# Patient Record
Sex: Female | Born: 1940 | Race: White | Hispanic: No | Smoking: Former smoker
Health system: Southern US, Community
[De-identification: ages and names within clinical notes are randomized; demographics above are authoritative.]

## PROBLEM LIST (undated history)

## (undated) DIAGNOSIS — E785 Hyperlipidemia, unspecified: Secondary | ICD-10-CM

## (undated) DIAGNOSIS — R5382 Chronic fatigue, unspecified: Secondary | ICD-10-CM

## (undated) DIAGNOSIS — F411 Generalized anxiety disorder: Secondary | ICD-10-CM

## (undated) DIAGNOSIS — H269 Unspecified cataract: Secondary | ICD-10-CM

## (undated) DIAGNOSIS — M810 Age-related osteoporosis without current pathological fracture: Secondary | ICD-10-CM

## (undated) DIAGNOSIS — H33012 Retinal detachment with single break, left eye: Secondary | ICD-10-CM

## (undated) DIAGNOSIS — Z9849 Cataract extraction status, unspecified eye: Secondary | ICD-10-CM

## (undated) DIAGNOSIS — J309 Allergic rhinitis, unspecified: Secondary | ICD-10-CM

## (undated) DIAGNOSIS — J4530 Mild persistent asthma, uncomplicated: Secondary | ICD-10-CM

## (undated) DIAGNOSIS — R7303 Prediabetes: Secondary | ICD-10-CM

## (undated) DIAGNOSIS — N814 Uterovaginal prolapse, unspecified: Secondary | ICD-10-CM

## (undated) DIAGNOSIS — R7301 Impaired fasting glucose: Secondary | ICD-10-CM

## (undated) DIAGNOSIS — H409 Unspecified glaucoma: Secondary | ICD-10-CM

## (undated) HISTORY — DX: Mild persistent asthma, uncomplicated: J45.30

## (undated) HISTORY — DX: Allergic rhinitis, unspecified: J30.9

## (undated) HISTORY — DX: Impaired fasting glucose: R73.01

## (undated) HISTORY — DX: Generalized anxiety disorder: F41.1

## (undated) HISTORY — DX: Prediabetes: R73.03

## (undated) HISTORY — DX: Hyperlipidemia, unspecified: E78.5

## (undated) HISTORY — PX: APPENDECTOMY: SHX54

## (undated) HISTORY — PX: TONSILLECTOMY: SUR1361

## (undated) HISTORY — DX: Uterovaginal prolapse, unspecified: N81.4

## (undated) HISTORY — DX: Cataract extraction status, unspecified eye: Z98.49

## (undated) HISTORY — PX: HAND SURGERY: SHX662

## (undated) HISTORY — DX: Retinal detachment with single break, left eye: H33.012

## (undated) HISTORY — DX: Chronic fatigue, unspecified: R53.82

## (undated) HISTORY — DX: Unspecified glaucoma: H40.9

## (undated) HISTORY — DX: Age-related osteoporosis without current pathological fracture: M81.0

## (undated) HISTORY — DX: Unspecified cataract: H26.9

---

## 2001-01-31 ENCOUNTER — Encounter: Payer: Self-pay | Admitting: Internal Medicine

## 2001-01-31 ENCOUNTER — Ambulatory Visit (HOSPITAL_COMMUNITY): Admission: RE | Admit: 2001-01-31 | Discharge: 2001-01-31 | Payer: Self-pay | Admitting: Internal Medicine

## 2007-04-17 ENCOUNTER — Observation Stay (HOSPITAL_COMMUNITY): Admission: EM | Admit: 2007-04-17 | Discharge: 2007-04-18 | Payer: Self-pay | Admitting: Emergency Medicine

## 2008-02-13 ENCOUNTER — Ambulatory Visit (HOSPITAL_COMMUNITY): Admission: RE | Admit: 2008-02-13 | Discharge: 2008-02-13 | Payer: Self-pay | Admitting: Family Medicine

## 2008-03-01 ENCOUNTER — Other Ambulatory Visit: Admission: RE | Admit: 2008-03-01 | Discharge: 2008-03-01 | Payer: Self-pay | Admitting: Family Medicine

## 2010-06-27 ENCOUNTER — Ambulatory Visit (HOSPITAL_COMMUNITY): Admission: RE | Admit: 2010-06-27 | Discharge: 2010-06-27 | Payer: Self-pay | Admitting: Family Medicine

## 2010-07-04 ENCOUNTER — Other Ambulatory Visit: Admission: RE | Admit: 2010-07-04 | Discharge: 2010-07-04 | Payer: Self-pay | Admitting: *Deleted

## 2011-02-06 NOTE — Consult Note (Signed)
NAME:  Angelica Serrano, Angelica Serrano                   ACCOUNT NO.:  [id number]   MEDICAL RECORD NO.:  [id number]          PATIENT TYPE:  OBV   LOCATION:  2025                         FACILITY:  [facility]   PHYSICIAN:  Corinna L. Lendell Caprice, MDDATE OF BIRTH:  1941/04/03   DATE OF CONSULTATION:  04/17/2007  DATE OF DISCHARGE:                                 CONSULTATION   REASON FOR CONSULTATION:  Abnormal chest x-ray, rule out pneumonia, rule  out otitis media.   IMPRESSION/RECOMMENDATIONS:  1. Despite portable chest x-ray findings, there is no infiltrate on PA      and lateral chest x-ray.  Also there is no clinical evidence for      pneumonia.  No antibiotics are indicated at this time.  2. No clinical signs of otitis media.  3. Resolved vertigo.  Recommend Meclizine as needed if this returns.  4. Vomiting most likely secondary to #3.   HISTORY OF PRESENT ILLNESS:  Angelica Serrano is a 70 year old white female who  has seen Dr. Delrae Alfred in the past but has not found a new primary care  physician since Dr. Delrae Alfred has left [place] at [facility].  She had  some dizziness when she turned her head to the right earlier today.  She  also had some nausea and vomiting and possibly a loose stool, but it  sounds as if it was not truly diarrhea.  She has had no fevers, chills.  She has had no ear pain.  She has had no cough.  She has had no  shortness of breath.  She did have some diaphoresis, and apparently her  blood pressure was elevated at home when she took it with a cuff.  [facility], and there were  concerns about abnormalities on EKG, so she was sent to the emergency  room.  She is being admitted to the cardiology service and apparently  will get a stress test in the morning.  She has no history of coronary  artery disease.  She has never had an episode like this in the past.  She has no tinnitus, no headache, no difficulty walking or focal  weakness.   PAST  MEDICAL HISTORY:  Seasonal allergic rhinitis.   MEDICATIONS:  Zyrtec as needed.   SOCIAL HISTORY:  The patient quit smoking years ago.  She drinks rarely.   FAMILY HISTORY:  Her mother died of pneumonia.  Dad died of prostate  cancer.   ALLERGIES:  NO KNOWN DRUG ALLERGIES.   REVIEW OF SYSTEMS:  As above, otherwise negative.   PHYSICAL EXAMINATION:  VITAL SIGNS:  Blood pressure 166/82, pulse 59,  respiratory rate 18, temperature 97.7, oxygen saturation 99% on room  air.  GENERAL:  The patient is well-nourished, well-developed, in no acute  distress.  HEENT:  Normocephalic, atraumatic.  Pupils equal, round, and reactive to  light.  No nystagmus.  Left ear canal is occluded with cerumen.  Right  tympanic membrane is without any bulging or purulence.  No erythema.  She has no tenderness of  the pinna or canal.  Nares are patent.  Oropharynx without erythema or exudate.  NECK:  Supple.  No lymphadenopathy.  LUNGS:  Clear to auscultation bilaterally without wheezes, rhonchi, or  rales.  CARDIOVASCULAR:  Regular rate and rhythm without murmurs, gallops, or  rubs.  ABDOMEN:  Normal bowel sounds, soft, nontender, nondistended.  GU AND RECTAL:  Deferred.  EXTREMITIES:  No clubbing, cyanosis, or edema.  NEUROLOGIC:  Cranial nerves are intact.  Alert and oriented.  Motor  strength 5/5 throughout.  Light touch sensation intact.  SKIN:  No rash.   LABORATORY DATA:  CBC is unremarkable.  Basic metabolic panel  unremarkable.  Liver function tests unremarkable.  Point-of-care enzymes  negative.  EKG shows normal sinus rhythm with nonspecific changes.  Portable chest x-ray shows possible right lower lobe infiltrate.  PA and  lateral chest x-ray shows no infiltrate.  Abdominal x-ray is  unremarkable.   I would like to thank Dr. Mayford Knife for this consultation.      Corinna L. Lendell Caprice, MD  Electronically Signed     CLS/MEDQ  D:  04/17/2007  T:  04/18/2007  Job:  [id number]   cc:    Particia Jasper, M.D.  Armanda Magic, M.D.

## 2011-06-15 ENCOUNTER — Other Ambulatory Visit: Payer: Self-pay | Admitting: Family Medicine

## 2011-06-15 DIAGNOSIS — Z1231 Encounter for screening mammogram for malignant neoplasm of breast: Secondary | ICD-10-CM

## 2011-07-03 ENCOUNTER — Ambulatory Visit (HOSPITAL_COMMUNITY): Payer: PRIVATE HEALTH INSURANCE

## 2011-07-03 ENCOUNTER — Other Ambulatory Visit: Payer: Self-pay | Admitting: Dermatology

## 2011-07-09 LAB — DIFFERENTIAL
Basophils Relative: 1
Eosinophils Absolute: 0.1
Eosinophils Relative: 1
Lymphs Abs: 1.4
Monocytes Relative: 5

## 2011-07-09 LAB — CARDIAC PANEL(CRET KIN+CKTOT+MB+TROPI)
CK, MB: 1.8
Total CK: 110
Troponin I: 0.02

## 2011-07-09 LAB — COMPREHENSIVE METABOLIC PANEL
ALT: 16
AST: 23
Alkaline Phosphatase: 79
CO2: 23
Calcium: 9.4
GFR calc Af Amer: >60
GFR calc non Af Amer: >60
Potassium: 3.7
Sodium: 139
Total Protein: 6.9

## 2011-07-09 LAB — CBC
HCT: 40.2
Hemoglobin: 13.9
Hemoglobin: 14
MCHC: 33.8
MCHC: 34.6
MCV: 91
RBC: 4.42
RBC: 4.59

## 2011-07-09 LAB — URINALYSIS, ROUTINE W REFLEX MICROSCOPIC
Bilirubin Urine: NEGATIVE
Glucose, UA: NEGATIVE
Specific Gravity, Urine: 1.016
Urobilinogen, UA: 0.2
pH: 5.5

## 2011-07-09 LAB — CK TOTAL AND CKMB (NOT AT ARMC)
Relative Index: 1.8
Total CK: 133

## 2011-07-09 LAB — URINE MICROSCOPIC-ADD ON

## 2011-07-09 LAB — LIPID PANEL
Total CHOL/HDL Ratio: 5.9
VLDL: 41 — ABNORMAL HIGH

## 2011-07-09 LAB — POCT CARDIAC MARKERS: Troponin i, poc: <0.05

## 2011-07-09 LAB — MAGNESIUM: Magnesium: 2.5

## 2011-07-26 ENCOUNTER — Other Ambulatory Visit: Payer: Self-pay | Admitting: Dermatology

## 2011-07-31 ENCOUNTER — Ambulatory Visit (HOSPITAL_COMMUNITY): Payer: PRIVATE HEALTH INSURANCE

## 2011-08-28 ENCOUNTER — Ambulatory Visit (HOSPITAL_COMMUNITY): Payer: PRIVATE HEALTH INSURANCE

## 2011-09-24 ENCOUNTER — Ambulatory Visit (HOSPITAL_COMMUNITY)
Admission: RE | Admit: 2011-09-24 | Discharge: 2011-09-24 | Disposition: A | Discharge status: Home / Self Care | Payer: PRIVATE HEALTH INSURANCE | Source: Ambulatory Visit | Attending: Family Medicine | Admitting: Family Medicine

## 2011-09-24 DIAGNOSIS — Z1231 Encounter for screening mammogram for malignant neoplasm of breast: Secondary | ICD-10-CM | POA: Insufficient documentation

## 2013-04-29 ENCOUNTER — Other Ambulatory Visit (HOSPITAL_COMMUNITY): Payer: Self-pay | Admitting: Family Medicine

## 2013-04-29 DIAGNOSIS — Z1231 Encounter for screening mammogram for malignant neoplasm of breast: Secondary | ICD-10-CM

## 2013-05-18 ENCOUNTER — Ambulatory Visit (HOSPITAL_COMMUNITY)
Admission: RE | Admit: 2013-05-18 | Discharge: 2013-05-18 | Disposition: A | Discharge status: Home / Self Care | Payer: PRIVATE HEALTH INSURANCE | Source: Ambulatory Visit | Attending: Family Medicine | Admitting: Family Medicine

## 2013-05-18 DIAGNOSIS — Z1231 Encounter for screening mammogram for malignant neoplasm of breast: Secondary | ICD-10-CM | POA: Insufficient documentation

## 2013-10-19 ENCOUNTER — Other Ambulatory Visit: Payer: Self-pay | Admitting: Obstetrics & Gynecology

## 2014-04-19 ENCOUNTER — Other Ambulatory Visit: Payer: Self-pay | Admitting: Obstetrics & Gynecology

## 2014-04-19 ENCOUNTER — Other Ambulatory Visit (HOSPITAL_COMMUNITY)
Admission: RE | Admit: 2014-04-19 | Discharge: 2014-04-19 | Disposition: A | Discharge status: Home / Self Care | Payer: Medicare HMO | Source: Ambulatory Visit | Attending: Obstetrics & Gynecology | Admitting: Obstetrics & Gynecology

## 2014-04-19 DIAGNOSIS — Z1151 Encounter for screening for human papillomavirus (HPV): Secondary | ICD-10-CM | POA: Diagnosis present

## 2014-04-19 DIAGNOSIS — Z124 Encounter for screening for malignant neoplasm of cervix: Secondary | ICD-10-CM | POA: Diagnosis present

## 2014-04-19 DIAGNOSIS — R87810 Cervical high risk human papillomavirus (HPV) DNA test positive: Secondary | ICD-10-CM | POA: Diagnosis present

## 2014-04-20 LAB — CYTOLOGY - PAP

## 2014-07-06 ENCOUNTER — Other Ambulatory Visit (HOSPITAL_COMMUNITY): Payer: Self-pay | Admitting: Family Medicine

## 2014-07-06 DIAGNOSIS — Z1231 Encounter for screening mammogram for malignant neoplasm of breast: Secondary | ICD-10-CM

## 2014-08-02 ENCOUNTER — Ambulatory Visit (HOSPITAL_COMMUNITY)
Admission: RE | Admit: 2014-08-02 | Discharge: 2014-08-02 | Disposition: A | Discharge status: Home / Self Care | Payer: Medicare HMO | Source: Ambulatory Visit | Attending: Family Medicine | Admitting: Family Medicine

## 2014-08-02 DIAGNOSIS — Z1231 Encounter for screening mammogram for malignant neoplasm of breast: Secondary | ICD-10-CM | POA: Insufficient documentation

## 2014-09-21 ENCOUNTER — Other Ambulatory Visit: Payer: Self-pay

## 2014-09-24 DIAGNOSIS — C44310 Basal cell carcinoma of skin of unspecified parts of face: Secondary | ICD-10-CM

## 2014-09-24 DIAGNOSIS — I440 Atrioventricular block, first degree: Secondary | ICD-10-CM

## 2014-09-24 HISTORY — DX: Atrioventricular block, first degree: I44.0

## 2014-09-24 HISTORY — DX: Basal cell carcinoma of skin of unspecified parts of face: C44.310

## 2015-03-21 ENCOUNTER — Ambulatory Visit: Payer: Medicare HMO | Admitting: Physical Therapy

## 2015-03-23 ENCOUNTER — Encounter: Payer: Self-pay | Admitting: Physical Therapy

## 2015-03-23 ENCOUNTER — Ambulatory Visit: Payer: Commercial Managed Care - HMO | Attending: Obstetrics & Gynecology | Admitting: Physical Therapy

## 2015-03-23 DIAGNOSIS — N8189 Other female genital prolapse: Secondary | ICD-10-CM | POA: Diagnosis present

## 2015-03-23 NOTE — Patient Instructions (Signed)
Quick Contraction: Gravity Eliminated (Hook-Lying)   Lie with hips and knees bent. Quickly squeeze then fully relax pelvic floor. Perform _1__ sets of _5__. Rest for _1__ seconds between sets. Do _3__ times a day.   Copyright  VHI. All rights reserved.  Slow Contraction: Gravity Eliminated (Hook-Lying)   Lie with hips and knees bent. Slowly squeeze pelvic floor for _10__ seconds. Rest for _5__ seconds. Repeat __10_ times. Do __3_ times a day.   Copyright  VHI. All rights reserved.  About Pelvic Support Problems Pelvic Support Problems Explained Ligaments, muscles, and connective tissue normally hold your bladder, uterus, and other organs in their proper places in your pelvis. When these tissues become weak, a problem with pelvic support may result. Weak support can cause one or more of the pelvic organs to drop down into the vagina. An organ may even drop so far that is partially exposed outside the body.  Pelvic support problems are named by the change in the organ. The Curran types of pelvic support problems are:  . Cystocele: When the bladder drops down into your vagina.  . Enterocele: When your small intestine drops between your vagina and rectum.  . Rectocele: When your rectum bulges into the vaginal wall.  Marland Kitchen Uterine prolapse: When your uterus drops into your vagina.  . Vaginal prolapse: When the top part of the vagina begins to droop. This sometimes happens after a hysterectomy (removal of the uterus).  Causes Pelvic support problems can be caused by many conditions. They may begin after you give birth, especially if you had a large baby. During childbirth, the muscles and skin of the birth canal (vagina) are stretched and sometimes torn. They heal over time but are not always exactly the same. A long pushing stage of labor may also weaken these tissues as well as very rapid births as the tissues do not have time to stretch so they tear.  Also, after menopause, there are changes in the  vaginal walls resulting from a decrease in estrogen. Estrogen helps to keep the tissues toned. Low levels of estrogen weaken the vaginal walls and may cause the bladder to shift from its normal position. As women get older, the loss of muscle tone and the relaxation of muscles may cause the uterus or other organs to drop.  Over time, conditions like chronic coughing, chronic constipation, doing a lot of heavy lifting, straining to pass stool, and obesity, can also weaken the pelvic support muscles.  Diagnosing Pelvic Support Problems Your health care provider will ask about your symptoms and do a pelvic examination. Your provider may also do a rectal exam during your pelvic exam. Your provider may ask you to: 1. Bear down and push (like you are having a bowel movement) so he or she can see if your bladder or other part of your body protrudes into the vagina. 2. Contract the muscles of your pelvis to check the strength of your pelvic muscles.  3. Do several types of urine, nerve and muscle tests of the pelvis and around the bladder to see what type of treatment is best for you.   Symptoms Symptoms of pelvic support problems depend on the organ involved, but may include:  . urine leakage  . stain or fecal loss after a bowel movement . trouble having bowel movements  . ache in the lower abdomen, groin, or lower back  . bladder infection  . a feeling of heaviness, pulling, or fullness in the pelvis, or a feeling that  something is falling out of the vagina  . an organ protruding from your vaginal opening  . feeling the need to support the organs or perineal area to empty bladder or bowels . painful sexual intercourse.  Many women feel pelvic pressure or trouble holding their urine immediately after childbirth. For some, these symptoms go away permanently, in others they return as they get older.  Treatment Options A prolapsed organ cannot repair itself. Contact your health care provider as soon as  you notice symptoms of a problem. Treatment depends on what the specific problem is and how far advanced it is.  . The symptoms caused by some pelvic support problems may simply be treated with changes in diet, medicine to soften the stool, weight loss, or avoiding strenuous activities. You may also do pelvic floor exercises to help strengthen your pelvic muscles.  . Some cases of prolapse may require a special support device made from plastic or rubber called a pessary that fits into the vagina to support the uterus, vagina, or bladder. A pessary can also help women who leak urine when coughing, straining, or exercising. In mild cases, a tampon or vaginal diaphragm may be used instead of a pessary.  Talk to your doctor or health care provider about these options. . In serious cases, surgery may be needed to put the organs back into their proper place. The uterus may be removed because of the pressure it puts on the bladder.  Your doctor will know what surgery will be best for you. How can I prevent pelvic support problems?  You can help prevent pelvic support problems by:  . maintaining a healthy lifestyle  . continuing to do pelvic floor exercises after you deliver a baby  . maintaining a healthy weight  . avoiding a lot of heavy lifting and lifting with your legs (not from your waist)  . treating constipation and avoid getting constipated by eating high fiber foods.   [facility] [address] [place], [place] [address] Phone # [phone] Fax [phone]

## 2015-03-23 NOTE — Therapy (Signed)
[facility] [address] [place], [place], [address] Phone: [phone]   Fax:  [phone]  Physical Therapy Evaluation  Patient Details  Name: Angelica Serrano MRN: [id number] Date of Birth: 03-Dec-1940 Referring Provider:  Janyth Pupa, DO  Encounter Date: 03/23/2015      PT End of Session - 03/23/15 1312    Visit Number 1   Number of Visits 10  Medicare   Date for PT Re-Evaluation 05/18/15   PT Start Time 6962   PT Stop Time 9528   PT Time Calculation (min) 35 min   Activity Tolerance Patient tolerated treatment well   Behavior During Therapy [facility] for tasks assessed/performed      Past Medical History  Diagnosis Date  . Glaucoma   . Cataract     Past Surgical History  Procedure Laterality Date  . Appendectomy      There were no vitals filed for this visit.  Visit Diagnosis:  Pelvic floor weakness in female - Plan: PT plan of care cert/re-cert      Subjective Assessment - 03/23/15 1241    Subjective Patient reports she had an active yard day with lifting and felt something out bulging out of the vagina 3 weeks ago. Patient is able to push the prolapse up and if she takes it easy.    Patient Stated Goals work on pelvic floor strength   Currently in Pain? No/denies            [facility] PT Assessment - 03/23/15 0001    Assessment   Medical Diagnosis N81.10 pelvic organ prolapse quantification stage 2   Onset Date/Surgical Date 03/02/15   Prior Therapy none   Precautions   Precautions None   Balance Screen   Has the patient fallen in the past 6 months No   Has the patient had a decrease in activity level because of a fear of falling?  No   Is the patient reluctant to leave their home because of a fear of falling?  No   Prior Function   Level of Independence Independent   Observation/Other Assessments   Focus on Therapeutic Outcomes (FOTO)  42% limitation   ROM / Strength   AROM / PROM /  Strength AROM;Strength   AROM   AROM Assessment Site Lumbar   Lumbar Flexion full   Lumbar Extension full   Lumbar - Right Side Bend full   Lumbar - Left Side Bend full   Lumbar - Right Rotation full   Lumbar - Left Rotation full   Strength   Overall Strength Comments bil. hip extension 4/5   Palpation   Palpation comment pelvis in correct alignment                 Pelvic Floor Special Questions - 03/23/15 0001    Are you Pregnant or attempting pregnancy? No   Prior Pregnancies Yes   Number of Pregnancies 4   Number of Vaginal Deliveries 4   Any difficulty with labor and deliveries No   Episiotomy Performed Yes   Urinary Leakage No   Urinary urgency No   Falling out feeling (prolapse) Yes   Activities that cause feeling of prolapse lifting   Pelvic Floor Internal Exam Patient confirms identification and approves therapist to assess muscle strength and integrity   Exam Type Vaginal   Strength fair squeeze, definite lift   Strength # of reps 5   Strength # of seconds 5  PT Education - 04/22/2015 1313    Education provided Yes   Education Details pelvic floor contraction; information on uterine prolapse   Person(s) Educated Patient   Methods Explanation;Demonstration;Tactile cues;Verbal cues;Handout   Comprehension Returned demonstration;Verbalized understanding          PT Short Term Goals - 04-22-15 1324    PT SHORT TERM GOAL #1   Title understand how to lift without a valsalva maneuver   Time 4   Period Weeks   Status New   PT SHORT TERM GOAL #2   Title understand ways to manage prolapse   Time 4   Period Weeks           PT Long Term Goals - 22-Apr-2015 1325    PT LONG TERM GOAL #1   Title pelvic floor strength >/= 4/5   Time 8   Period Weeks   Status New   PT LONG TERM GOAL #2   Title prolapse reduced >/= 50% due to increased pelvic floor strength    Time 4   Period Weeks   Status New   PT LONG TERM GOAL #3    Title toileting technique to reduce strain on prolapse   Time 8   Period Weeks   Status New   PT LONG TERM GOAL #4   Title understand how to perform abdominal massage to decrease strain on prolapse   Time 8   Period Weeks   Status New               Plan - April 22, 2015 1314    Clinical Impression Statement Patient is a 74 year old female with diagnosis of pelvic organ prolapse quantification stage 2.  Patient reports 3 weeks ago she was doing alot of yard work and afterwards felt something in her vagina.  Patient reports it happened again when she was lifting  groceries. FOTO score is 42% limitation.  Pelvic floor strength is 2/5 holding for  seconds and verbal cues to not compensate with gluteal muscles. Abodminal strength is 3/5.  Bilateral hip extension 4/5. Patient would benefit from physical therapy to strengthen her pelvic floor and core and educated on how to perform activities without bearing down .    Pt will benefit from skilled therapeutic intervention in order to improve on the following deficits Decreased strength;Improper body mechanics;Decreased activity tolerance;Decreased endurance   Rehab Potential Excellent   Clinical Impairments Affecting Rehab Potential None   PT Frequency 1x / week   PT Duration 8 weeks   PT Treatment/Interventions ADLs/Self Care Home Management;Biofeedback;Therapeutic activities;Therapeutic exercise;Neuromuscular re-education;Manual techniques;Patient/family education   PT Next Visit Plan how to manage prolapse, toileting technique, pelvic floor contraction with abdominal bracing, lifting without valaslva maneuver   PT Home Exercise Plan toileting   Recommended Other Services None   Consulted and Agree with Plan of Care Patient          G-Codes - 2015/04/22 1243    Functional Assessment Tool Used FOTO score is 42%    Functional Limitation Other PT primary   Other PT Primary Current Status (I3382) At least 40 percent but less than 60 percent  impaired, limited or restricted   Other PT Primary Goal Status (N0539) At least 20 percent but less than 40 percent impaired, limited or restricted       Problem List There are no active problems to display for this patient.   Ladarrius Bogdanski,PT 04/22/15, 1:30 PM  [facility] [address]  [place], [place], [address] Phone: [phone]   Fax:  [phone]

## 2015-03-29 ENCOUNTER — Encounter: Payer: Self-pay | Admitting: Physical Therapy

## 2015-03-29 ENCOUNTER — Ambulatory Visit: Payer: Commercial Managed Care - HMO | Attending: Obstetrics & Gynecology | Admitting: Physical Therapy

## 2015-03-29 DIAGNOSIS — N8189 Other female genital prolapse: Secondary | ICD-10-CM | POA: Insufficient documentation

## 2015-03-29 NOTE — Patient Instructions (Signed)
3 times per day lay on back with hips on 2 pillows and feet elevated for 10-15 min. Helps reduced the prolapse.   Lifting Principles .Maintain proper posture and head alignment. .Slide object as close as possible before lifting. .Move obstacles out of the way. .Test before lifting; ask for help if too heavy. .Tighten stomach muscles without holding breath. .Use smooth movements; do not jerk. .Use legs to do the work, and pivot with feet. .Distribute the work load symmetrically and close to the center of trunk. .Push instead of pull whenever possible. Always breathe out when picking up object.   Copyright  VHI. All rights reserved.  Sleeping on Back   Place pillow under knees and 2 under hips. A pillow with cervical support and a roll around waist are also helpful.   Copyright  VHI. All rights reserved.  Sleeping on Side   Place pillow between knees. Use cervical support under neck and a roll around waist as needed.   Copyright  VHI. All rights reserved.  Sleeping on Side   Place pillow between knees and between feet. Use cervical support under neck and a roll around waist as needed.   Copyright  VHI. All rights reserved.  Pushing / Pulling   Pushing is preferable to pulling. Keep back in proper alignment, and use leg muscles to do the work.   Copyright  VHI. All rights reserved.  Abdominal Bracing With Pelvic Floor (Hook-Lying)   With neutral spine, tighten pelvic floor and abdominals. Hold for 10 seconds. rest for 5 seconds. Repeat _10__ times. Do _3__ times a day. Do when you are laying down.  No holding your breathe.  Gentle strengthening of pelvic floor.   Copyright  VHI. All rights reserved.  Toileting Techniques for Bowel Movements (Defecation) Using your belly (abdomen) and pelvic floor muscles to have a bowel movement is usually instinctive.  Sometimes people can have problems with these muscles and have to relearn proper defecation (emptying)  techniques.  If you have weakness in your muscles, organs that are falling out, decreased sensation in your pelvis, or ignore your urge to go, you may find yourself straining to have a bowel movement.  You are straining if you are: . holding your breath or taking in a huge gulp of air and holding it  . keeping your lips and jaw tensed and closed tightly . turning red in the face because of excessive pushing or forcing . developing or worsening your  hemorrhoids . getting faint while pushing . not emptying completely and have to defecate many times a day  If you are straining, you are actually making it harder for yourself to have a bowel movement.  Many people find they are pulling up with the pelvic floor muscles and closing off instead of opening the anus. Due to lack pelvic floor relaxation and coordination the abdominal muscles, one has to work harder to push the feces out.  Many people have never been taught how to defecate efficiently and effectively.  Notice what happens to your body when you are having a bowel movement.  While you are sitting on the toilet pay attention to the following areas: . Jaw and mouth position . Angle of your hips   . Whether your feet touch the ground or not . Arm placement  . Spine position . Waist . Belly tension . Anus (opening of the anal canal)  An Evacuation/Defecation Plan   Here are the 4 basic points:  1. Lean forward enough  for your elbows to rest on your knees 2. Support your feet on the floor or use a low stool if your feet don't touch the floor  3. Push out your belly as if you have swallowed a beach ball-you should feel a widening of your waist 4. Open and relax your pelvic floor muscles, rather than tightening around the anus      The following conditions my require modifications to your toileting posture:  . If you have had surgery in the past that limits your back, hip, pelvic, knee or ankle flexibility . Constipation   Your  healthcare practitioner may make the following additional suggestions and adjustments:  1) Sit on the toilet  a) Make sure your feet are supported. b) Notice your hip angle and spine position-most people find it effective to lean forward or raise their knees, which can help the muscles around the anus to relax  c) When you lean forward, place your forearms on your thighs for support  2) Relax suggestions a) Breath deeply in through your nose and out slowly through your mouth as if you are smelling the flowers and blowing out the candles. b) To become aware of how to relax your muscles, contracting and releasing muscles can be helpful.  Pull your pelvic floor muscles in tightly by using the image of holding back gas, or closing around the anus (visualize making a circle smaller) and lifting the anus up and in.  Then release the muscles and your anus should drop down and feel open. Repeat 5 times ending with the feeling of relaxation. c) Keep your pelvic floor muscles relaxed; let your belly bulge out. d) The digestive tract starts at the mouth and ends at the anal opening, so be sure to relax both ends of the tube.  Place your tongue on the roof of your mouth with your teeth separated.  This helps relax your mouth and will help to relax the anus at the same time.  3) Empty (defecation) a) Keep your pelvic floor and sphincter relaxed, then bulge your anal muscles.  Make the anal opening wide.  b) Stick your belly out as if you have swallowed a beach ball. c) Make your belly wall hard using your belly muscles while continuing to breathe. Doing this makes it easier to open your anus. d) Breath out and give a grunt (or try using other sounds such as ahhhh, shhhhh, ohhhh or grrrrrrr).  4) Finish a) As you finish your bowel movement, pull the pelvic floor muscles up and in.  This will leave your anus in the proper place rather than remaining pushed out and down. If you leave your anus pushed out and  down, it will start to feel as though that is normal and give you incorrect signals about needing to have a bowel movement.    Angelica Serrano, PT [facility] [address] [place], [place] [address] Phone# [phone]

## 2015-03-29 NOTE — Therapy (Signed)
[facility] [address] [place], [place], [address] Phone: [phone]   Fax:  [phone]  Physical Therapy Treatment  Patient Details  Name: Angelica Serrano MRN: [id number] Date of Birth: [birth year] Referring Provider:  Janyth Pupa, DO  Encounter Date: 03/29/2015      PT End of Session - 03/29/15 0855    Visit Number 2   Number of Visits 10  Medicare   Date for PT Re-Evaluation 05/18/15   PT Start Time 0845   PT Stop Time 0925   PT Time Calculation (min) 40 min   Activity Tolerance Patient tolerated treatment well   Behavior During Therapy [facility] for tasks assessed/performed      Past Medical History  Diagnosis Date  . Glaucoma   . Cataract     Past Surgical History  Procedure Laterality Date  . Appendectomy      There were no vitals filed for this visit.  Visit Diagnosis:  Pelvic floor weakness in female      Subjective Assessment - 03/29/15 0854    Subjective I had trouble this weekend with my bladder falling.  I was working hard with my exercises.  I was uncomfortable with feeling the bladder hanging. Bladder at the edge.    Patient Stated Goals work on pelvic floor strength   Currently in Pain? No/denies                                 PT Education - 03/29/15 0915    Education provided Yes   Education Details lifting, pelvic floor contraction, laying with hips on pillows to reduce prolapse, body mechanics, toileting technique   Person(s) Educated Patient   Methods Explanation;Demonstration;Tactile cues;Verbal cues;Handout   Comprehension Returned demonstration;Verbalized understanding          PT Short Term Goals - 03/29/15 0916    PT SHORT TERM GOAL #1   Title understand how to lift without a valsalva maneuver   Time 4   Period Weeks   Status On-going  just learned   PT SHORT TERM GOAL #2   Title understand ways to manage prolapse   Time 4   Period Weeks    Status Achieved           PT Long Term Goals - 03/23/15 1325    PT LONG TERM GOAL #1   Title pelvic floor strength >/= 4/5   Time 8   Period Weeks   Status New   PT LONG TERM GOAL #2   Title prolapse reduced >/= 50% due to increased pelvic floor strength    Time 4   Period Weeks   Status New   PT LONG TERM GOAL #3   Title toileting technique to reduce strain on prolapse   Time 8   Period Weeks   Status New   PT LONG TERM GOAL #4   Title understand how to perform abdominal massage to decrease strain on prolapse   Time 8   Period Weeks   Status New               Plan - 03/29/15 6270    Clinical Impression Statement Patient has learned ways to manage her prolapse but still require verbal cues.  Patient has learned lifting techniques and ways to move while bracing her abdominals and contracting pelvic floor . Patient  was having difficulty with preventing a prloapse due to  not knowing about no valsalva and contracting while she is bearing down. Patient would  benefit from physical therapy to increas strength of pelvic floor and know ways to manage prolapse.    Pt will benefit from skilled therapeutic intervention in order to improve on the following deficits Decreased strength;Improper body mechanics;Decreased activity tolerance;Decreased endurance   Rehab Potential Excellent   Clinical Impairments Affecting Rehab Potential None   PT Frequency 1x / week   PT Duration 8 weeks   PT Treatment/Interventions ADLs/Self Care Home Management;Biofeedback;Therapeutic activities;Therapeutic exercise;Neuromuscular re-education;Manual techniques;Patient/family education   PT Next Visit Plan pelvic floor contraction in sidely, pelvic floor EMG   PT Home Exercise Plan progress as needed.    Consulted and Agree with Plan of Care Patient        Problem List There are no active problems to display for this patient.   GRAY,CHERYL,PT 03/29/2015, 9:30 AM  [facility] [address] [place], [place], [address] Phone: [phone]   Fax:  [phone]

## 2015-04-18 ENCOUNTER — Encounter: Payer: Self-pay | Admitting: Physical Therapy

## 2015-04-18 ENCOUNTER — Ambulatory Visit: Payer: Commercial Managed Care - HMO | Admitting: Physical Therapy

## 2015-04-18 DIAGNOSIS — N8189 Other female genital prolapse: Secondary | ICD-10-CM

## 2015-04-18 NOTE — Therapy (Signed)
[facility] [address] [place], [place], [address] Phone: [phone]   Fax:  [phone]  Physical Therapy Treatment  Patient Details  Name: Angelica Serrano MRN: [id number] Date of Birth: Sep 21, 1941 Referring Provider:  Janyth Pupa, DO  Encounter Date: 04/18/2015      PT End of Session - 04/18/15 1454    Visit Number 3   Number of Visits 10  Medicare   Date for PT Re-Evaluation 05/18/15   PT Start Time 7893   PT Stop Time 1440   PT Time Calculation (min) 1435 min   Activity Tolerance Patient tolerated treatment well   Behavior During Therapy [facility] for tasks assessed/performed      Past Medical History  Diagnosis Date  . Glaucoma   . Cataract     Past Surgical History  Procedure Laterality Date  . Appendectomy      There were no vitals filed for this visit.  Visit Diagnosis:  Pelvic floor weakness in female      Subjective Assessment - 04/18/15 1451    Subjective Going over the exercises have really helped. I have been doing better and better.    Patient Stated Goals work on pelvic floor strength   Currently in Pain? No/denies                         [facility] Adult PT Treatment/Exercise - 04/18/15 0001    Manual Therapy   Manual Therapy Soft tissue mobilization   Manual therapy comments abdominal massage to imrove intestinal mobility,   Soft tissue mobilization around the bladder area to imrpove tissue mobility                PT Education - 04/18/15 1514    Education provided Yes   Education Details abdominal massage, walking program, hip abduction in sidely, hip adduction in sidely, hip circles in sidely, triceps, alternate marching in supine   Person(s) Educated Patient   Methods Explanation;Demonstration;Verbal cues;Handout;Tactile cues   Comprehension Verbalized understanding;Returned demonstration          PT Short Term Goals - 04/18/15 1451    PT SHORT TERM  GOAL #1   Title understand how to lift without a valsalva maneuver   Time 4   Period Weeks   Status Achieved           PT Long Term Goals - 04/18/15 1452    PT LONG TERM GOAL #1   Title pelvic floor strength >/= 4/5   Time 8   Period Weeks   Status On-going   PT LONG TERM GOAL #2   Title prolapse reduced >/= 50% due to increased pelvic floor strength    Time 4   Period Weeks   Status On-going  able to keep prolapse up for longer periods   PT LONG TERM GOAL #3   Title toileting technique to reduce strain on prolapse   Time 8   Period Weeks   Status On-going   PT LONG TERM GOAL #4   Title understand how to perform abdominal massage to decrease strain on prolapse   Time 8   Period Weeks   Status On-going  just learned               Plan - 04/18/15 1515    Clinical Impression Statement Patient has met her STG.  Patient is able to have no prolapse on an non active day but during an active  day she still has difficulty. Patient has learned on how to perform abdominal massage to decrease constipation.  Patient is continuing to learn how to manage her prolapse and return to activity. Patient is able to contract her pelvic floor correctly. Patient continues to require physical therapy to strengthen her pelvic floor and how to exercise correctly while decreasing strain on pelvic floor.    Pt will benefit from skilled therapeutic intervention in order to improve on the following deficits Decreased strength;Improper body mechanics;Decreased activity tolerance;Decreased endurance   Rehab Potential Excellent   Clinical Impairments Affecting Rehab Potential None   PT Frequency 1x / week   PT Duration 8 weeks   PT Treatment/Interventions ADLs/Self Care Home Management;Biofeedback;Therapeutic activities;Therapeutic exercise;Neuromuscular re-education;Manual techniques;Patient/family education   PT Next Visit Plan exercise program with pelvic floor contraction   PT Home Exercise  Plan progress as needed.    Consulted and Agree with Plan of Care Patient        Problem List There are no active problems to display for this patient.   Eh Sesay,PT 04/18/2015, 3:29 PM  [facility] [address], [place], [place], [address] Phone: [phone]   Fax:  [phone]

## 2015-04-18 NOTE — Patient Instructions (Addendum)
Walk in mornings due to pelvic floor stronger. Walk for 1 mile per day.   About Abdominal Massage  Abdominal massage, also called external colon massage, is a self-treatment circular massage technique that can reduce and eliminate gas and ease constipation. The colon naturally contracts in waves in a clockwise direction starting from inside the right hip, moving up toward the ribs, across the belly, and down inside the left hip.  When you perform circular abdominal massage, you help stimulate your colon's normal wave pattern of movement called peristalsis.  It is most beneficial when done after eating.  Positioning You can practice abdominal massage with oil while lying down, or in the shower with soap.  Some people find that it is just as effective to do the massage through clothing while sitting or standing.  How to Massage Start by placing your finger tips or knuckles on your right side, just inside your hip bone.  . Make small circular movements while you move upward toward your rib cage.   . Once you reach the bottom right side of your rib cage, take your circular movements across to the left side of the bottom of your rib cage.  . Next, move downward until you reach the inside of your left hip bone.  This is the path your feces travel in your colon. . Continue to perform your abdominal massage in this pattern for 10 minutes each day.     You can apply as much pressure as is comfortable in your massage.  Start gently and build pressure as you continue to practice.  Notice any areas of pain as you massage; areas of slight pain may be relieved as you massage, but if you have areas of significant or intense pain, consult with your healthcare provider.  Other Considerations . General physical activity including bending and stretching can have a beneficial massage-like effect on the colon.  Deep breathing can also stimulate the colon because breathing deeply activates the same nervous system that  supplies the colon.   Abdominal massage should always be used in combination with a bowel-conscious diet that is high in the proper type of fiber for you, fluids (primarily water), and a regular exercise program.Strengthening: Hip Abduction (Side-Lying)   Tighten muscles on front of left thigh, then lift leg _6___ inches from surface as you breathe in , keeping knee locked. Pull up pelvic floor as your leg goes down and breath out Repeat _10-20___ times per set. Do __1__ sets per session. Do __1__ sessions per day. Do on other leg http://orth.exer.us/622   Copyright  VHI. All rights reserved.  Strengthening: Hip Adduction (Side-Lying)   Tighten muscles on front of right thigh, then lift leg _2___ inches from surface as you breathe in, keeping knee locked. As you breathe out bring leg down. Repeat _10-20___ times per set. Do __1__ sets per session. Do _1___ sessions per day. Do on other leg http://orth.exer.us/624   Copyright  VHI. All rights reserved.  Side Leg Circle   Lie on side, pull up pelvic floor, back straight along edge of mat, legs 30 in front of torso. Lift top leg to hip height. Rotate in small circle, __10__ times in each direction. Repeat __1__ times. Repeat on other side. Do _1___ sessions per day. Then do other side.  Copyright  VHI. All rights reserved.  Knee Fold   Lie on back, legs bent, arms by sides. Exhale, lifting knee to chest. Inhale, returning. Keep abdominals flat, navel to spine. Tighten the  pelvic floor the whole time.  Repeat _15___ times, alternating legs. Do __1__ sessions per day.  Copyright  VHI. All rights reserved.  Elbow Extension: Resisted   Lie on back, __2__ pound weight in right hand, arm up, elbow bent and supported. Straighten elbow. Return slowly. Keep abdominals tight and pull up pelvic floor Repeat __10__ times per set. Do ___2_ sets per session. Do __1__ sessions per day. Both arms.  Copyright  VHI. All rights reserved.   [facility] [address] [place], [place] [address] Phone # [phone] Fax [phone]

## 2015-05-02 ENCOUNTER — Encounter: Payer: Self-pay | Admitting: Physical Therapy

## 2015-05-02 ENCOUNTER — Ambulatory Visit: Payer: Commercial Managed Care - HMO | Attending: Obstetrics & Gynecology | Admitting: Physical Therapy

## 2015-05-02 DIAGNOSIS — N8189 Other female genital prolapse: Secondary | ICD-10-CM

## 2015-05-02 NOTE — Therapy (Addendum)
[facility] [address] [place], [place], [address] Phone: [phone]   Fax:  [phone]  Physical Therapy Treatment  Patient Details  Name: Angelica Serrano MRN: [id number] Date of Birth: 03/20/1941 Referring Provider:  Janyth Pupa, DO  Encounter Date: 05/02/2015      PT End of Session - 05/02/15 1152    Visit Number 4   Number of Visits 10  Medicare   Date for PT Re-Evaluation 07/13/15   PT Start Time 4008   PT Stop Time 1225   PT Time Calculation (min) 40 min   Activity Tolerance Patient tolerated treatment well   Behavior During Therapy [facility] for tasks assessed/performed      Past Medical History  Diagnosis Date  . Glaucoma   . Cataract     Past Surgical History  Procedure Laterality Date  . Appendectomy      There were no vitals filed for this visit.  Visit Diagnosis:  Pelvic floor weakness in female - Plan: PT plan of care cert/re-cert      Subjective Assessment - 05/02/15 1150    Subjective I was going to the bathroom and reached for magazine while coughing and my prolapse came out a littile.  I was freaked out.  I rested with my legs up.  When I get up I felt like things are going to fall out.  I mowed the lawn last night and did well.    Patient Stated Goals work on pelvic floor strength   Currently in Pain? No/denies   Multiple Pain Sites No            [facility] PT Assessment - 05/02/15 0001    Assessment   Medical Diagnosis N81.10 pelvic organ prolapse quantification stage 2   Onset Date/Surgical Date 03/02/15   Prior Therapy none   Prior Function   Level of Independence Independent   Observation/Other Assessments   Focus on Therapeutic Outcomes (FOTO)  42% limitation                  Pelvic Floor Special Questions - 05/02/15 0001    Biofeedback sensor type Surface  vaginal, hookly   Biofeedback Activity Quick contraction;10 second hold  assessment      Pelvic  floor assessment: Resting level prior to exercise: 1.38 uv 3 quick contractions: max 13.57 uv      Average 5.25 uv  Bulges stomach with contraction 10 second contraction: max  13.51 uv    Average 6.81 uv 20 second contraction: 6.53 uv Resting tone after exercise: 1.84 uv  Trapezoid program: with lower abdominal contraction, threshold 4.6uv. Verbal cues to not bulge stomach out.     G-code is 42% limitation due to patient not returning to therapy due to having surgery on 07/21/2015.  Functional limitation is Other PT primary, goal status is CJ, and discharge status is CK. Earlie Counts, PT 05/23/2015 4:35 PM           PT Education - 05/02/15 1226    Education provided Yes   Education Details continue present HEP but not bulge the abdomen   Person(s) Educated Patient   Methods Explanation   Comprehension Returned demonstration;Verbalized understanding          PT Short Term Goals - 04/18/15 1451    PT SHORT TERM GOAL #1   Title understand how to lift without a valsalva maneuver   Time 4   Period Weeks   Status Achieved  PT Long Term Goals - 05/02/15 1154    PT LONG TERM GOAL #1   Title pelvic floor strength >/= 4/5   Time 8   Period Weeks   Status On-going   PT LONG TERM GOAL #2   Title prolapse reduced >/= 50% due to increased pelvic floor strength    Time 4   Period Weeks   Status On-going  had prolpase over the weekend   PT LONG TERM GOAL #3   Title toileting technique to reduce strain on prolapse   Time 8   Period Weeks   Status Achieved   PT LONG TERM GOAL #4   Title understand how to perform abdominal massage to decrease strain on prolapse   Time 8   Period Weeks   Status Achieved               Plan - 05/02/15 1214    Clinical Impression Statement Patient is a 74 year old female with diagnosis of pelvic organ prolapse.  Patient has met all of her STG's and LTG 3 and 4.  Patient is able to contract to 6.81 uv for 10 second  contraction, 20 second contraction  is 6.53uv and resting level is 1.84 uv.  Patient  will bulge her abdomen with contracting pelvc floor therefore pushing out the prolapse.  Patient needs verbal cues to tighten abdomen to contract pelvic floor. Patient has difficulty with quick peaks with quick contractions.  Patient would benefit from physical therapy to further strengthen her pelvic floor to reduc the prolapse and increase strength if abdomen.    Pt will benefit from skilled therapeutic intervention in order to improve on the following deficits Decreased strength;Improper body mechanics;Decreased activity tolerance;Decreased endurance   Rehab Potential Excellent   Clinical Impairments Affecting Rehab Potential None   PT Frequency 1x / week   PT Duration 8 weeks   PT Treatment/Interventions ADLs/Self Care Home Management;Biofeedback;Therapeutic activities;Therapeutic exercise;Neuromuscular re-education;Manual techniques;Patient/family education   PT Next Visit Plan continue with pelvic floor strength. Internal soft tissue work to release urethra and prolapse, abdominal exercises   PT Home Exercise Plan progress as needed.    Consulted and Agree with Plan of Care Patient        Problem List There are no active problems to display for this patient.   GRAY,CHERYL,PT 05/02/2015, 12:28 PM  [facility] [address], [place], [place], [address] Phone: [phone]   Fax:  [phone]  PHYSICAL THERAPY DISCHARGE SUMMARY  Visits from Start of Care: 4  Current functional level related to goals / functional outcomes: Unable to fully assess patient due to her not returning.  Patient called on 05/23/2015 to cancel all appointments and be discharged.  Patient is going to have surgery to repair her prolapse on 07/21/2015. See above for goal assessment.    Remaining deficits: See above   Education / Equipment: HEP Plan: Patient  agrees to discharge.  Patient goals were not met. Patient is being discharged due to the patient's request.Thank you for the referral. Earlie Counts, PT 05/23/2015 4:37 PM   ?????

## 2015-05-23 ENCOUNTER — Encounter: Payer: Medicare HMO | Admitting: Physical Therapy

## 2015-06-06 ENCOUNTER — Ambulatory Visit: Payer: Commercial Managed Care - HMO | Admitting: Physical Therapy

## 2015-06-20 ENCOUNTER — Encounter: Payer: Commercial Managed Care - HMO | Admitting: Physical Therapy

## 2015-06-28 ENCOUNTER — Other Ambulatory Visit: Payer: Self-pay | Admitting: Family

## 2015-06-28 DIAGNOSIS — E2839 Other primary ovarian failure: Secondary | ICD-10-CM

## 2015-06-29 ENCOUNTER — Other Ambulatory Visit: Payer: Self-pay

## 2015-06-29 DIAGNOSIS — Z1231 Encounter for screening mammogram for malignant neoplasm of breast: Secondary | ICD-10-CM

## 2015-07-04 ENCOUNTER — Ambulatory Visit: Payer: Commercial Managed Care - HMO | Admitting: Physical Therapy

## 2015-07-05 ENCOUNTER — Encounter (HOSPITAL_COMMUNITY): Payer: Self-pay

## 2015-07-05 ENCOUNTER — Other Ambulatory Visit: Payer: Self-pay

## 2015-07-05 ENCOUNTER — Encounter (HOSPITAL_COMMUNITY)
Admission: RE | Admit: 2015-07-05 | Discharge: 2015-07-05 | Disposition: A | Discharge status: Home / Self Care | Payer: Commercial Managed Care - HMO | Source: Ambulatory Visit | Attending: Obstetrics and Gynecology | Admitting: Obstetrics and Gynecology

## 2015-07-05 DIAGNOSIS — Z01818 Encounter for other preprocedural examination: Secondary | ICD-10-CM | POA: Insufficient documentation

## 2015-07-05 DIAGNOSIS — N814 Uterovaginal prolapse, unspecified: Secondary | ICD-10-CM | POA: Insufficient documentation

## 2015-07-05 LAB — CBC
HCT: 39.9 % (ref 36.0–46.0)
Hemoglobin: 13.3 g/dL (ref 12.0–15.0)
MCH: 31.5 pg (ref 26.0–34.0)
MCHC: 33.3 g/dL (ref 30.0–36.0)
MCV: 94.5 fL (ref 78.0–100.0)
Platelets: 216 10*3/uL (ref 150–400)
RBC: 4.22 MIL/uL (ref 3.87–5.11)
RDW: 12.5 % (ref 11.5–15.5)
WBC: 7.1 10*3/uL (ref 4.0–10.5)

## 2015-07-05 NOTE — Patient Instructions (Addendum)
   Your procedure is scheduled on: OCT 27 (THURSDAY)  Enter through the [facility] Entrance of [facility] at: 6AM  Pick up the phone at the desk and dial [phone] and inform us of your arrival.  Please call this number if you have any problems the morning of surgery: [phone]  DO NOT EAT OR DRINK AFTER OCT 26 (WEDNESDAY)  YOU MAY USE EYE DROPS DAY OF SURGERY   Do not wear jewelry, make-up, or FINGER nail polish No metal in your hair or on your body. Do not wear lotions, powders, perfumes.  You may wear deodorant.  Do not bring valuables to the hospital. Contacts, dentures or bridgework may not be worn into surgery.  Leave suitcase in the car. After Surgery it may be brought to your room. For patients being admitted to the hospital, checkout time is 11:00am the day of discharge.

## 2015-07-06 ENCOUNTER — Other Ambulatory Visit: Payer: Self-pay | Admitting: Obstetrics and Gynecology

## 2015-07-06 NOTE — H&P (Signed)
  74 year old G 4 P 4 with symptomatic prolapse.  She feels a bulge in her vagina that has been going on for the past month.  She denies any urinary incontinence.  There were no vitals taken for this visit. No results found for this or any previous visit (from the past 24 hour(s)). Past Medical History  Diagnosis Date  . Glaucoma   . Cataract    Past Surgical History  Procedure Laterality Date  . Appendectomy    . Tonsillectomy    . Hand surgery     Allergies NKDA No family history on file. Review of Systems  All other systems reviewed and are negative.  General alert and oriented Lung CTAB Car RRR Abdomen is soft and non tender  Pelvic  Grade 1 - 2 cystocele Grade 2 - 3 cervical uterine prolapse Uterus is small and she has good support of the posterior rectal wall  IMPRESSION: Symptomatic Pelvic organ Prolapse  PLAN: TVH, possible APRepair, Possible sacrospinous ligament fixation   Risks reviewed Consent signed

## 2015-07-21 ENCOUNTER — Encounter (HOSPITAL_COMMUNITY)
Admission: RE | Disposition: A | Discharge status: Home / Self Care | Payer: Self-pay | Source: Ambulatory Visit | Attending: Obstetrics and Gynecology

## 2015-07-21 ENCOUNTER — Encounter (HOSPITAL_COMMUNITY): Payer: Self-pay | Admitting: *Deleted

## 2015-07-21 ENCOUNTER — Inpatient Hospital Stay (HOSPITAL_COMMUNITY): Payer: Commercial Managed Care - HMO | Admitting: Registered Nurse

## 2015-07-21 ENCOUNTER — Observation Stay (HOSPITAL_COMMUNITY)
Admission: RE | Admit: 2015-07-21 | Discharge: 2015-07-22 | Disposition: A | Discharge status: Home / Self Care | Payer: Commercial Managed Care - HMO | Source: Ambulatory Visit | Attending: Obstetrics and Gynecology | Admitting: Obstetrics and Gynecology

## 2015-07-21 DIAGNOSIS — Z90710 Acquired absence of both cervix and uterus: Secondary | ICD-10-CM | POA: Diagnosis present

## 2015-07-21 DIAGNOSIS — N814 Uterovaginal prolapse, unspecified: Principal | ICD-10-CM | POA: Insufficient documentation

## 2015-07-21 DIAGNOSIS — Z87891 Personal history of nicotine dependence: Secondary | ICD-10-CM | POA: Diagnosis not present

## 2015-07-21 DIAGNOSIS — N800 Endometriosis of uterus: Secondary | ICD-10-CM | POA: Insufficient documentation

## 2015-07-21 HISTORY — PX: VAGINAL HYSTERECTOMY: SHX2639

## 2015-07-21 HISTORY — DX: Acquired absence of both cervix and uterus: Z90.710

## 2015-07-21 HISTORY — PX: ANTERIOR AND POSTERIOR REPAIR WITH SACROSPINOUS FIXATION: SHX6536

## 2015-07-21 LAB — TYPE AND SCREEN
ABO/RH(D): O NEG
ANTIBODY SCREEN: NEGATIVE

## 2015-07-21 LAB — ABO/RH: ABO/RH(D): O NEG

## 2015-07-21 SURGERY — HYSTERECTOMY, VAGINAL
Anesthesia: Spinal

## 2015-07-21 MED ORDER — LACTATED RINGERS IV SOLN
INTRAVENOUS | Status: DC
  Administered 2015-07-21: 11:00:00 via INTRAVENOUS

## 2015-07-21 MED ORDER — ONDANSETRON HCL 4 MG/2ML IJ SOLN
INTRAMUSCULAR | Status: AC
  Filled 2015-07-21: qty 2

## 2015-07-21 MED ORDER — CEFAZOLIN SODIUM-DEXTROSE 2-3 GM-% IV SOLR
2.0000 g | INTRAVENOUS | Status: AC
  Administered 2015-07-21: 2 g via INTRAVENOUS

## 2015-07-21 MED ORDER — NALBUPHINE HCL 10 MG/ML IJ SOLN: 5.0000 mg | Freq: Once | INTRAMUSCULAR | Status: DC | PRN

## 2015-07-21 MED ORDER — KETOROLAC TROMETHAMINE 30 MG/ML IJ SOLN: 30.0000 mg | Freq: Once | INTRAMUSCULAR | Status: DC | PRN

## 2015-07-21 MED ORDER — PROPOFOL 500 MG/50ML IV EMUL
INTRAVENOUS | Status: AC
  Filled 2015-07-21: qty 50

## 2015-07-21 MED ORDER — KETOROLAC TROMETHAMINE 30 MG/ML IJ SOLN: 30.0000 mg | Freq: Four times a day (QID) | INTRAMUSCULAR | Status: DC | PRN

## 2015-07-21 MED ORDER — MIDAZOLAM HCL 2 MG/2ML IJ SOLN
INTRAMUSCULAR | Status: AC
  Filled 2015-07-21: qty 4

## 2015-07-21 MED ORDER — KETOROLAC TROMETHAMINE 30 MG/ML IJ SOLN: 30.0000 mg | Freq: Once | INTRAMUSCULAR | Status: DC

## 2015-07-21 MED ORDER — MEPERIDINE HCL 25 MG/ML IJ SOLN: 6.2500 mg | INTRAMUSCULAR | Status: DC | PRN

## 2015-07-21 MED ORDER — DEXAMETHASONE SODIUM PHOSPHATE 4 MG/ML IJ SOLN
INTRAMUSCULAR | Status: AC
  Filled 2015-07-21: qty 1

## 2015-07-21 MED ORDER — MIDAZOLAM HCL 5 MG/5ML IJ SOLN
INTRAMUSCULAR | Status: DC | PRN
  Administered 2015-07-21 (×2): 1 mg via INTRAVENOUS

## 2015-07-21 MED ORDER — TRAMADOL HCL 50 MG PO TABS: 50.0000 mg | ORAL_TABLET | Freq: Four times a day (QID) | ORAL | Status: DC | PRN

## 2015-07-21 MED ORDER — IBUPROFEN 600 MG PO TABS: 600.0000 mg | ORAL_TABLET | Freq: Four times a day (QID) | ORAL | Status: DC | PRN

## 2015-07-21 MED ORDER — PROPOFOL 500 MG/50ML IV EMUL
INTRAVENOUS | Status: DC | PRN
  Administered 2015-07-21: 50 ug/kg/min via INTRAVENOUS

## 2015-07-21 MED ORDER — SODIUM CHLORIDE 0.9 % IJ SOLN: 3.0000 mL | INTRAMUSCULAR | Status: DC | PRN

## 2015-07-21 MED ORDER — DEXAMETHASONE SODIUM PHOSPHATE 4 MG/ML IJ SOLN
INTRAMUSCULAR | Status: DC | PRN
  Administered 2015-07-21: 4 mg via INTRAVENOUS

## 2015-07-21 MED ORDER — NALBUPHINE HCL 10 MG/ML IJ SOLN: 5.0000 mg | INTRAMUSCULAR | Status: DC | PRN

## 2015-07-21 MED ORDER — PROPOFOL 10 MG/ML IV BOLUS
INTRAVENOUS | Status: AC
  Filled 2015-07-21: qty 20

## 2015-07-21 MED ORDER — BUPIVACAINE-EPINEPHRINE (PF) 0.5% -1:200000 IJ SOLN
INTRAMUSCULAR | Status: AC
  Filled 2015-07-21: qty 30

## 2015-07-21 MED ORDER — LIDOCAINE HCL (CARDIAC) 20 MG/ML IV SOLN
INTRAVENOUS | Status: AC
  Filled 2015-07-21: qty 5

## 2015-07-21 MED ORDER — MORPHINE SULFATE (PF) 0.5 MG/ML IJ SOLN
INTRAMUSCULAR | Status: DC | PRN
  Administered 2015-07-21: .2 mg via INTRATHECAL

## 2015-07-21 MED ORDER — IBUPROFEN 600 MG PO TABS
600.0000 mg | ORAL_TABLET | Freq: Four times a day (QID) | ORAL | Status: DC | PRN
  Administered 2015-07-22 (×2): 600 mg via ORAL
  Filled 2015-07-21 (×2): qty 1

## 2015-07-21 MED ORDER — KETOROLAC TROMETHAMINE 30 MG/ML IJ SOLN
INTRAMUSCULAR | Status: AC
  Filled 2015-07-21: qty 1

## 2015-07-21 MED ORDER — DIPHENHYDRAMINE HCL 50 MG/ML IJ SOLN: 12.5000 mg | INTRAMUSCULAR | Status: DC | PRN

## 2015-07-21 MED ORDER — ONDANSETRON HCL 4 MG/2ML IJ SOLN
4.0000 mg | Freq: Once | INTRAMUSCULAR | Status: DC | PRN
Start: 2015-07-21 — End: 2015-07-21

## 2015-07-21 MED ORDER — KETOROLAC TROMETHAMINE 30 MG/ML IJ SOLN
INTRAMUSCULAR | Status: DC | PRN
  Administered 2015-07-21: 15 mg via INTRAVENOUS

## 2015-07-21 MED ORDER — DEXTROSE 5 % IV SOLN
1.0000 ug/kg/h | INTRAVENOUS | Status: DC | PRN
  Filled 2015-07-21: qty 2

## 2015-07-21 MED ORDER — ESTRADIOL 0.1 MG/GM VA CREA
TOPICAL_CREAM | VAGINAL | Status: AC
  Filled 2015-07-21: qty 42.5

## 2015-07-21 MED ORDER — EPHEDRINE SULFATE 50 MG/ML IJ SOLN
INTRAMUSCULAR | Status: DC | PRN
  Administered 2015-07-21: 10 mg via INTRAVENOUS
  Administered 2015-07-21: 20 mg via INTRAVENOUS

## 2015-07-21 MED ORDER — EPHEDRINE 5 MG/ML INJ
INTRAVENOUS | Status: AC
  Filled 2015-07-21: qty 10

## 2015-07-21 MED ORDER — NALOXONE HCL 0.4 MG/ML IJ SOLN: 0.4000 mg | INTRAMUSCULAR | Status: DC | PRN

## 2015-07-21 MED ORDER — MENTHOL 3 MG MT LOZG: 1.0000 | LOZENGE | OROMUCOSAL | Status: DC | PRN

## 2015-07-21 MED ORDER — LACTATED RINGERS IV SOLN
INTRAVENOUS | Status: DC
  Administered 2015-07-21 (×2): via INTRAVENOUS

## 2015-07-21 MED ORDER — FENTANYL CITRATE (PF) 100 MCG/2ML IJ SOLN: 25.0000 ug | INTRAMUSCULAR | Status: DC | PRN

## 2015-07-21 MED ORDER — FENTANYL CITRATE (PF) 100 MCG/2ML IJ SOLN
INTRAMUSCULAR | Status: DC | PRN
  Administered 2015-07-21 (×2): 50 ug via INTRAVENOUS

## 2015-07-21 MED ORDER — ONDANSETRON HCL 4 MG/2ML IJ SOLN
INTRAMUSCULAR | Status: DC | PRN
  Administered 2015-07-21: 4 mg via INTRAVENOUS

## 2015-07-21 MED ORDER — CEFAZOLIN SODIUM-DEXTROSE 2-3 GM-% IV SOLR
INTRAVENOUS | Status: AC
  Filled 2015-07-21: qty 50

## 2015-07-21 MED ORDER — FENTANYL CITRATE (PF) 250 MCG/5ML IJ SOLN
INTRAMUSCULAR | Status: AC
  Filled 2015-07-21: qty 25

## 2015-07-21 MED ORDER — BUPIVACAINE IN DEXTROSE 0.75-8.25 % IT SOLN
INTRATHECAL | Status: DC | PRN
  Administered 2015-07-21: 12 mg via INTRATHECAL

## 2015-07-21 MED ORDER — GLYCOPYRROLATE 0.2 MG/ML IJ SOLN
INTRAMUSCULAR | Status: AC
  Filled 2015-07-21: qty 1

## 2015-07-21 MED ORDER — ONDANSETRON HCL 4 MG/2ML IJ SOLN
4.0000 mg | Freq: Three times a day (TID) | INTRAMUSCULAR | Status: DC | PRN
  Administered 2015-07-21: 4 mg via INTRAVENOUS
  Filled 2015-07-21: qty 2

## 2015-07-21 MED ORDER — MORPHINE SULFATE (PF) 0.5 MG/ML IJ SOLN
INTRAMUSCULAR | Status: AC
  Filled 2015-07-21: qty 100

## 2015-07-21 MED ORDER — ACETAMINOPHEN 500 MG PO TABS
1000.0000 mg | ORAL_TABLET | Freq: Four times a day (QID) | ORAL | Status: AC
  Administered 2015-07-21: 1000 mg via ORAL
  Filled 2015-07-21: qty 2

## 2015-07-21 MED ORDER — DIPHENHYDRAMINE HCL 25 MG PO CAPS
25.0000 mg | ORAL_CAPSULE | ORAL | Status: DC | PRN
  Filled 2015-07-21: qty 1

## 2015-07-21 MED ORDER — LIDOCAINE HCL (CARDIAC) 20 MG/ML IV SOLN
INTRAVENOUS | Status: DC | PRN
  Administered 2015-07-21: 40 mg via INTRAVENOUS

## 2015-07-21 SURGICAL SUPPLY — 37 items
BLADE SURG 10 STRL SS (BLADE) ×2 IMPLANT
BLADE SURG 15 STRL LF C SS BP (BLADE) ×3 IMPLANT
BLADE SURG 15 STRL SS (BLADE)
CANISTER SUCT 3000ML (MISCELLANEOUS) ×3 IMPLANT
CLOTH BEACON ORANGE TIMEOUT ST (SAFETY) ×3 IMPLANT
CONT PATH 16OZ SNAP LID 3702 (MISCELLANEOUS) IMPLANT
DECANTER SPIKE VIAL GLASS SM (MISCELLANEOUS) IMPLANT
DEVICE CAPIO SLIM SINGLE (INSTRUMENTS) ×3 IMPLANT
GLOVE BIO SURGEON STRL SZ 6.5 (GLOVE) ×2 IMPLANT
GLOVE BIO SURGEONS STRL SZ 6.5 (GLOVE) ×1
GLOVE BIOGEL PI IND STRL 6.5 (GLOVE) ×1 IMPLANT
GLOVE BIOGEL PI INDICATOR 6.5 (GLOVE) ×2
GLOVE NEODERM STER SZ 7 (GLOVE) ×2 IMPLANT
GOWN STRL REUS W/TWL LRG LVL3 (GOWN DISPOSABLE) ×12 IMPLANT
NDL HYPO 25X5/8 SAFETYGLIDE (NEEDLE) ×1 IMPLANT
NDL SPNL 22GX3.5 QUINCKE BK (NEEDLE) IMPLANT
NEEDLE HYPO 22GX1.5 SAFETY (NEEDLE) IMPLANT
NEEDLE HYPO 25X5/8 SAFETYGLIDE (NEEDLE) ×3 IMPLANT
NEEDLE SPNL 22GX3.5 QUINCKE BK (NEEDLE) IMPLANT
NS IRRIG 1000ML POUR BTL (IV SOLUTION) ×3 IMPLANT
PACK VAGINAL WOMENS (CUSTOM PROCEDURE TRAY) ×3 IMPLANT
PAD OB MATERNITY 4.3X12.25 (PERSONAL CARE ITEMS) ×3 IMPLANT
SET CYSTO W/LG BORE CLAMP LF (SET/KITS/TRAYS/PACK) ×3 IMPLANT
SUT CAPIO ETHIBPND (SUTURE) IMPLANT
SUT CAPIO POLYGLYCOLIC (SUTURE) IMPLANT
SUT PROLENE 1 CT 1 30 (SUTURE) IMPLANT
SUT VIC AB 0 CT1 18XCR BRD8 (SUTURE) ×4 IMPLANT
SUT VIC AB 0 CT1 27 (SUTURE) ×12
SUT VIC AB 0 CT1 27XBRD ANBCTR (SUTURE) ×4 IMPLANT
SUT VIC AB 0 CT1 8-18 (SUTURE) ×12
SUT VIC AB 2-0 UR5 27 (SUTURE) ×9 IMPLANT
SUT VIC AB 3-0 SH 27 (SUTURE)
SUT VIC AB 3-0 SH 27X BRD (SUTURE) IMPLANT
SUT VICRYL 0 TIES 12 18 (SUTURE) ×3 IMPLANT
TOWEL OR 17X24 6PK STRL BLUE (TOWEL DISPOSABLE) ×6 IMPLANT
TRAY FOLEY CATH SILVER 14FR (SET/KITS/TRAYS/PACK) ×3 IMPLANT
WATER STERILE IRR 1000ML POUR (IV SOLUTION) ×3 IMPLANT

## 2015-07-21 NOTE — Anesthesia Postprocedure Evaluation (Signed)
  Anesthesia Post-op Note  Patient: Angelica Serrano  Procedure(s) Performed: Procedure(s): HYSTERECTOMY VAGINAL (N/A) ANTERIOR Repair  AND [place] cul de sac culdoplasty (N/A)  Patient Location: Women's Unit  Anesthesia Type:Spinal  Level of Consciousness: awake  Airway and Oxygen Therapy: Patient Spontanous Breathing  Post-op Pain: mild  Post-op Assessment: Post-op Vital signs reviewed, Patient's Cardiovascular Status Stable and Respiratory Function Stable LLE Motor Response: Purposeful movement LLE Sensation: Tingling RLE Motor Response: Purposeful movement RLE Sensation: Tingling      Post-op Vital Signs: stable  Last Vitals:  Filed Vitals:   07/21/15 1300  BP: 128/63  Pulse: 55  Temp:   Resp: 16    Complications: No apparent anesthesia complications

## 2015-07-21 NOTE — Anesthesia Postprocedure Evaluation (Signed)
Anesthesia Post Note  Patient: Angelica Serrano  Procedure(s) Performed: Procedure(s) (LRB): HYSTERECTOMY VAGINAL (N/A) ANTERIOR Repair  AND Gaspar Garbe cul de sac culdoplasty (N/A)  Anesthesia type: Spinal  Patient location: PACU  Post pain: Pain level controlled  Post assessment: Post-op Vital signs reviewed  Last Vitals:  Filed Vitals:   07/21/15 1000  BP: 132/61  Pulse: 48  Temp:   Resp: 12    Post vital signs: Reviewed  Level of consciousness: awake  Complications: No apparent anesthesia complications

## 2015-07-21 NOTE — Anesthesia Preprocedure Evaluation (Signed)
Anesthesia Evaluation  Patient identified by MRN, date of birth, ID band Patient awake    Reviewed: Allergy & Precautions, H&P , NPO status , Patient's Chart, lab work & pertinent test results  Airway Mallampati: I  TM Distance: >3 FB Neck ROM: full    Dental no notable dental hx.    Pulmonary former smoker,    Pulmonary exam normal        Cardiovascular negative cardio ROS Normal cardiovascular exam     Neuro/Psych negative neurological ROS  negative psych ROS   GI/Hepatic negative GI ROS, Neg liver ROS,   Endo/Other  negative endocrine ROS  Renal/GU negative Renal ROS     Musculoskeletal negative musculoskeletal ROS (+)   Abdominal Normal abdominal exam  (+)   Peds  Hematology negative hematology ROS (+)   Anesthesia Other Findings   Reproductive/Obstetrics negative OB ROS                             Anesthesia Physical Anesthesia Plan  ASA: II  Anesthesia Plan: Spinal   Post-op Pain Management:    Induction:   Airway Management Planned:   Additional Equipment:   Intra-op Plan:   Post-operative Plan:   Informed Consent: I have reviewed the patients History and Physical, chart, labs and discussed the procedure including the risks, benefits and alternatives for the proposed anesthesia with the patient or authorized representative who has indicated his/her understanding and acceptance.     Plan Discussed with: CRNA and Surgeon  Anesthesia Plan Comments:         Anesthesia Quick Evaluation

## 2015-07-21 NOTE — Anesthesia Procedure Notes (Signed)
Spinal Patient location during procedure: OR Start time: 07/21/2015 7:29 AM End time: 07/21/2015 7:32 AM Staffing Anesthesiologist: Lyn Hollingshead Performed by: anesthesiologist  Preanesthetic Checklist Completed: patient identified, surgical consent, pre-op evaluation, timeout performed, IV checked, risks and benefits discussed and monitors and equipment checked Spinal Block Patient position: sitting Prep: site prepped and draped and DuraPrep Patient monitoring: heart rate, cardiac monitor, continuous pulse ox and blood pressure Approach: midline Location: L3-4 Injection technique: single-shot Needle Needle type: Pencan  Needle gauge: 24 G Needle length: 9 cm Needle insertion depth: 5 cm Assessment Sensory level: T10

## 2015-07-21 NOTE — Transfer of Care (Signed)
Immediate Anesthesia Transfer of Care Note  Patient: Angelica Serrano  Procedure(s) Performed: Procedure(s): HYSTERECTOMY VAGINAL (N/A) ANTERIOR AND POSTERIOR REPAIR WITH SACROSPINOUS LIGAMENT SUSPENSION (N/A)  Patient Location: PACU  Anesthesia Type:Spinal  Level of Consciousness: awake, alert , oriented and patient cooperative  Airway & Oxygen Therapy: Patient Spontanous Breathing and Patient connected to nasal cannula oxygen  Post-op Assessment: Report given to RN and Post -op Vital signs reviewed and stable  Post vital signs: Reviewed and stable  Last Vitals:  Filed Vitals:   07/21/15 0648  BP: 138/81  Pulse: 57  Temp: 36.4 C  Resp: 18    Complications: No apparent anesthesia complications

## 2015-07-21 NOTE — Addendum Note (Signed)
Addendum  created 07/21/15 1335 by Ignacia Bayley, CRNA   Modules edited: Notes Section   Notes Section:  File: [id number]

## 2015-07-21 NOTE — Op Note (Signed)
Angelica Serrano, Angelica Serrano NO.:  [id number]  MEDICAL RECORD NO.:  [id number]  LOCATION:  4196                          FACILITY:  [facility]  PHYSICIAN:  Shavona Gunderman L. Nikesha Kwasny, M.D.DATE OF BIRTH:  Aug 12, 1941  DATE OF PROCEDURE:  07/21/2015 DATE OF DISCHARGE:                              OPERATIVE REPORT   PREOPERATIVE DIAGNOSES:  Uterine prolapse and cystocele.  POSTOPERATIVE DIAGNOSES:  Uterine prolapse and cystocele.  PROCEDURE:  Total vaginal hysterectomy, anterior colporrhaphy, and McCall cul-de-sac culdoplasty.  SURGEON:  Clarke Amburn L. Helane Rima, M.D.  ASSISTANT:  Monia Sabal. Corinna Capra, M.D.  ANESTHESIA:  Spinal.  EBL:  100 mL.  COMPLICATIONS:  None.  DRAINS:  Foley.  PATHOLOGY:  Uterus and cervix.  DESCRIPTION OF PROCEDURE:  The patient was taken to the operating room. She was given her spinal.  She was prepped and draped in usual sterile fashion.  A Foley catheter was inserted.  Exam under anesthesia revealed a grade 2-3 cervical uterine prolapse and grade 2-3 cystocele.  No obvious rectocele was noted.  A weighted speculum was placed in the vagina.  The cervix was grasped with a tenaculum and circumferential incision was made around the cervix.  We then dissected the overlying vaginal epithelium away and entered the posterior cul-de-sac and anterior cul-de-sac easily using Metzenbaum scissors.  A weighted speculum was placed in the cul-de-sac.  Curved Heaney clamps were used to clamp the uterosacral cardinal ligaments on either side.  Each pedicle was clamped, cut, and suture ligated using 0-Vicryl suture.  We worked our way up the uterus whereby each pedicle was clamped, cut, and suture ligated using 0-Vicryl suture.  We retroflexed the uterus.  The remainder of the broad ligament and triple pedicle was clamped.  The specimen was removed.  The pedicles were secured using free tie and a tie of 0-Vicryl suture.  Hemostasis was very good.  At this point, we then noted  that the anterior vaginal wall was still sagging and I felt that she should have anterior colporrhaphy.  Allis clamps were placed to the apex.  A midline incision was made up the anterior vaginal wall dissecting the underlying vesicovaginal fascia away.  We reduced the cystocele with 1 pursestring suture using 2-0 Vicryl.  We trimmed the redundant vaginal epithelium and then closed that portion of the incision with 2-0 Vicryl in a running locked stitch.  At this point, I converted to a very short weighted speculum.  Inspected the posterior vaginal wall.  She had good support and no obvious rectocele noted.  I felt like she did not need a posterior repair or perineoplasty.  I decided to proceed with a McCall cul-de-sac culdoplasty because she had good apical support.  A stitch was placed through the posterior cul-de- sac and through the vaginal apex on either side to give her some additional support of the vaginal apex.  This was done without difficulty.  We then closed the posterior cuff in a running stitch using 0-Vicryl suture.  Hemostasis was very good.  Of note, I did see the ovary high up in the pelvis prior to closure of the cuff.  They were not able to be removed, but  they appeared to be normal.  We then closed the cuff completely in a running locked stitch using 0-Vicryl suture and then I tied the McCall stitch down with excellent lift of the vaginal cuff.  At the end of the procedure, she had reduction of the cystocele, good apical support, and good support posteriorly.  There was no bleeding noted.  She had normal urine output, which was clear.  All sponge, lap, and instrument counts were correct x2.  The patient went to the recovery room in a stable condition.     Alexsus Papadopoulos L. Helane Rima, M.D.     Nevin Bloodgood  D:  07/21/2015  T:  07/21/2015  Job:  [id number]

## 2015-07-21 NOTE — Brief Op Note (Signed)
07/21/2015  8:45 AM  PATIENT:  Angelica Serrano  74 y.o. female  PRE-OPERATIVE DIAGNOSIS:   Pelvic Organ Prolapse POST-OPERATIVE DIAGNOSIS:  Uterine Prolapse Cystocele  PROCEDURE:   Total Vaginal Hysterectomy Anterior Repair McCall Cul de Sac Culdoplasty  SURGEON:  Surgeon(s) and Role:    * Dian Queen, MD - Primary  PHYSICIAN ASSISTANT:   ASSISTANTS: Dr. Louretta Shorten  ANESTHESIA:   spinal  EBL:  Total I/O In: 1400 [I.V.:1400] Out: 400 [Urine:300; Blood:100]  BLOOD ADMINISTERED:none  DRAINS: Urinary Catheter (Foley)   LOCAL MEDICATIONS USED:  NONE  SPECIMEN:  Source of Specimen:  uterus and cervix  DISPOSITION OF SPECIMEN:  PATHOLOGY  COUNTS:  YES  TOURNIQUET:  * No tourniquets in log *  DICTATION: .Other Dictation: Dictation Number [id number]  PLAN OF CARE: Admit for overnight observation  PATIENT DISPOSITION:  PACU - hemodynamically stable.   Delay start of Pharmacological VTE agent (>24hrs) due to surgical blood loss or risk of bleeding: not applicable

## 2015-07-21 NOTE — Progress Notes (Signed)
H and P on the chart No changes Consent signed

## 2015-07-22 ENCOUNTER — Encounter (HOSPITAL_COMMUNITY): Payer: Self-pay | Admitting: Obstetrics and Gynecology

## 2015-07-22 DIAGNOSIS — N814 Uterovaginal prolapse, unspecified: Secondary | ICD-10-CM | POA: Diagnosis not present

## 2015-07-22 LAB — CBC
HCT: 31.8 % — ABNORMAL LOW (ref 36.0–46.0)
HEMOGLOBIN: 10.7 g/dL — AB (ref 12.0–15.0)
MCH: 31.2 pg (ref 26.0–34.0)
MCHC: 33.6 g/dL (ref 30.0–36.0)
MCV: 92.7 fL (ref 78.0–100.0)
Platelets: 164 10*3/uL (ref 150–400)
RBC: 3.43 MIL/uL — AB (ref 3.87–5.11)
RDW: 12.5 % (ref 11.5–15.5)
WBC: 10.2 10*3/uL (ref 4.0–10.5)

## 2015-07-22 NOTE — Progress Notes (Signed)
Pt discharged to home with daughter.  Condition stable.  Pt ambulated to car with A. Lysbeth Galas, [place].  No equipment for home ordered at discharge.

## 2015-07-22 NOTE — Discharge Summary (Signed)
  Admission Diagnosis: Pelvic Organ Prolapse  Discharge Diagnosis: Same  Hospital Course: 74 year old female with pelvic organ prolapse. She underwent TVH, anterior repair and McCall Cul De Sac Culdoplasty. Surgery was uneventful. She was ambulating on POD #1 with no pain and voiding and tolerating regular diet. She was discharged home on POD #1.  She was given discharge instructions. She was not given any new medications. She will follow up in 1 week.  BP 84/49 mmHg  Pulse 50  Temp(Src) 98 F (36.7 C) (Oral)  Resp 16  Ht 5\' 4"  (1.626 m)  Wt 68.04 kg (150 lb)  BMI 25.73 kg/m2  SpO2 99% Abdomen is soft and non tender No vaginal bleeding noted  Results for orders placed or performed during the hospital encounter of 07/21/15 (from the past 24 hour(s))  CBC     Status: Abnormal   Collection Time: 07/22/15  5:40 AM  Result Value Ref Range   WBC 10.2 4.0 - 10.5 K/uL   RBC 3.43 (L) 3.87 - 5.11 MIL/uL   Hemoglobin 10.7 (L) 12.0 - 15.0 g/dL   HCT 31.8 (L) 36.0 - 46.0 %   MCV 92.7 78.0 - 100.0 fL   MCH 31.2 26.0 - 34.0 pg   MCHC 33.6 30.0 - 36.0 g/dL   RDW 12.5 11.5 - 15.5 %   Platelets 164 150 - 400 K/uL

## 2015-09-06 ENCOUNTER — Encounter (INDEPENDENT_AMBULATORY_CARE_PROVIDER_SITE_OTHER): Payer: Commercial Managed Care - HMO | Admitting: Ophthalmology

## 2015-09-06 DIAGNOSIS — H338 Other retinal detachments: Secondary | ICD-10-CM

## 2015-09-06 DIAGNOSIS — H43813 Vitreous degeneration, bilateral: Secondary | ICD-10-CM | POA: Diagnosis not present

## 2015-09-07 ENCOUNTER — Ambulatory Visit
Admission: RE | Admit: 2015-09-07 | Discharge: 2015-09-07 | Disposition: A | Discharge status: Home / Self Care | Payer: Commercial Managed Care - HMO | Source: Ambulatory Visit

## 2015-09-07 ENCOUNTER — Ambulatory Visit
Admission: RE | Admit: 2015-09-07 | Discharge: 2015-09-07 | Disposition: A | Discharge status: Home / Self Care | Payer: Commercial Managed Care - HMO | Source: Ambulatory Visit | Attending: Family | Admitting: Family

## 2015-09-07 DIAGNOSIS — E2839 Other primary ovarian failure: Secondary | ICD-10-CM

## 2015-09-07 DIAGNOSIS — Z1231 Encounter for screening mammogram for malignant neoplasm of breast: Secondary | ICD-10-CM

## 2015-09-16 ENCOUNTER — Encounter (INDEPENDENT_AMBULATORY_CARE_PROVIDER_SITE_OTHER): Payer: Commercial Managed Care - HMO | Admitting: Ophthalmology

## 2015-09-16 DIAGNOSIS — D3132 Benign neoplasm of left choroid: Secondary | ICD-10-CM

## 2015-09-16 DIAGNOSIS — H338 Other retinal detachments: Secondary | ICD-10-CM | POA: Diagnosis not present

## 2015-09-16 DIAGNOSIS — H43813 Vitreous degeneration, bilateral: Secondary | ICD-10-CM

## 2015-09-27 DIAGNOSIS — H401133 Primary open-angle glaucoma, bilateral, severe stage: Secondary | ICD-10-CM | POA: Diagnosis not present

## 2015-09-27 DIAGNOSIS — H26493 Other secondary cataract, bilateral: Secondary | ICD-10-CM | POA: Diagnosis not present

## 2015-09-27 DIAGNOSIS — H33012 Retinal detachment with single break, left eye: Secondary | ICD-10-CM | POA: Diagnosis not present

## 2015-09-27 DIAGNOSIS — Z961 Presence of intraocular lens: Secondary | ICD-10-CM | POA: Diagnosis not present

## 2015-10-07 DIAGNOSIS — J301 Allergic rhinitis due to pollen: Secondary | ICD-10-CM | POA: Diagnosis not present

## 2015-10-07 DIAGNOSIS — J3089 Other allergic rhinitis: Secondary | ICD-10-CM | POA: Diagnosis not present

## 2015-10-07 DIAGNOSIS — J3081 Allergic rhinitis due to animal (cat) (dog) hair and dander: Secondary | ICD-10-CM | POA: Diagnosis not present

## 2015-10-17 DIAGNOSIS — J3089 Other allergic rhinitis: Secondary | ICD-10-CM | POA: Diagnosis not present

## 2015-10-17 DIAGNOSIS — J3081 Allergic rhinitis due to animal (cat) (dog) hair and dander: Secondary | ICD-10-CM | POA: Diagnosis not present

## 2015-10-17 DIAGNOSIS — J301 Allergic rhinitis due to pollen: Secondary | ICD-10-CM | POA: Diagnosis not present

## 2015-10-20 DIAGNOSIS — R7301 Impaired fasting glucose: Secondary | ICD-10-CM | POA: Diagnosis not present

## 2015-10-20 DIAGNOSIS — F418 Other specified anxiety disorders: Secondary | ICD-10-CM | POA: Diagnosis not present

## 2015-10-20 DIAGNOSIS — M810 Age-related osteoporosis without current pathological fracture: Secondary | ICD-10-CM | POA: Diagnosis not present

## 2015-10-20 DIAGNOSIS — Z79899 Other long term (current) drug therapy: Secondary | ICD-10-CM | POA: Diagnosis not present

## 2015-10-20 DIAGNOSIS — E782 Mixed hyperlipidemia: Secondary | ICD-10-CM | POA: Diagnosis not present

## 2015-10-24 DIAGNOSIS — J3081 Allergic rhinitis due to animal (cat) (dog) hair and dander: Secondary | ICD-10-CM | POA: Diagnosis not present

## 2015-10-24 DIAGNOSIS — J3089 Other allergic rhinitis: Secondary | ICD-10-CM | POA: Diagnosis not present

## 2015-10-24 DIAGNOSIS — J301 Allergic rhinitis due to pollen: Secondary | ICD-10-CM | POA: Diagnosis not present

## 2015-11-01 DIAGNOSIS — Z961 Presence of intraocular lens: Secondary | ICD-10-CM | POA: Diagnosis not present

## 2015-11-01 DIAGNOSIS — H26493 Other secondary cataract, bilateral: Secondary | ICD-10-CM | POA: Diagnosis not present

## 2015-11-01 DIAGNOSIS — H33012 Retinal detachment with single break, left eye: Secondary | ICD-10-CM | POA: Diagnosis not present

## 2015-11-01 DIAGNOSIS — H401133 Primary open-angle glaucoma, bilateral, severe stage: Secondary | ICD-10-CM | POA: Diagnosis not present

## 2015-11-07 DIAGNOSIS — J301 Allergic rhinitis due to pollen: Secondary | ICD-10-CM | POA: Diagnosis not present

## 2015-11-07 DIAGNOSIS — J3081 Allergic rhinitis due to animal (cat) (dog) hair and dander: Secondary | ICD-10-CM | POA: Diagnosis not present

## 2015-11-07 DIAGNOSIS — J3089 Other allergic rhinitis: Secondary | ICD-10-CM | POA: Diagnosis not present

## 2015-11-17 DIAGNOSIS — J3089 Other allergic rhinitis: Secondary | ICD-10-CM | POA: Diagnosis not present

## 2015-11-17 DIAGNOSIS — J301 Allergic rhinitis due to pollen: Secondary | ICD-10-CM | POA: Diagnosis not present

## 2015-11-17 DIAGNOSIS — J3081 Allergic rhinitis due to animal (cat) (dog) hair and dander: Secondary | ICD-10-CM | POA: Diagnosis not present

## 2015-11-22 DIAGNOSIS — J301 Allergic rhinitis due to pollen: Secondary | ICD-10-CM | POA: Diagnosis not present

## 2015-11-22 DIAGNOSIS — J3081 Allergic rhinitis due to animal (cat) (dog) hair and dander: Secondary | ICD-10-CM | POA: Diagnosis not present

## 2015-11-22 DIAGNOSIS — J3089 Other allergic rhinitis: Secondary | ICD-10-CM | POA: Diagnosis not present

## 2015-11-29 DIAGNOSIS — J3081 Allergic rhinitis due to animal (cat) (dog) hair and dander: Secondary | ICD-10-CM | POA: Diagnosis not present

## 2015-11-29 DIAGNOSIS — J301 Allergic rhinitis due to pollen: Secondary | ICD-10-CM | POA: Diagnosis not present

## 2015-11-29 DIAGNOSIS — J3089 Other allergic rhinitis: Secondary | ICD-10-CM | POA: Diagnosis not present

## 2015-12-06 DIAGNOSIS — J301 Allergic rhinitis due to pollen: Secondary | ICD-10-CM | POA: Diagnosis not present

## 2015-12-06 DIAGNOSIS — J3081 Allergic rhinitis due to animal (cat) (dog) hair and dander: Secondary | ICD-10-CM | POA: Diagnosis not present

## 2015-12-06 DIAGNOSIS — J3089 Other allergic rhinitis: Secondary | ICD-10-CM | POA: Diagnosis not present

## 2015-12-08 DIAGNOSIS — J301 Allergic rhinitis due to pollen: Secondary | ICD-10-CM | POA: Diagnosis not present

## 2015-12-08 DIAGNOSIS — J3081 Allergic rhinitis due to animal (cat) (dog) hair and dander: Secondary | ICD-10-CM | POA: Diagnosis not present

## 2015-12-08 DIAGNOSIS — J3089 Other allergic rhinitis: Secondary | ICD-10-CM | POA: Diagnosis not present

## 2015-12-13 DIAGNOSIS — J301 Allergic rhinitis due to pollen: Secondary | ICD-10-CM | POA: Diagnosis not present

## 2015-12-13 DIAGNOSIS — J3089 Other allergic rhinitis: Secondary | ICD-10-CM | POA: Diagnosis not present

## 2015-12-13 DIAGNOSIS — J3081 Allergic rhinitis due to animal (cat) (dog) hair and dander: Secondary | ICD-10-CM | POA: Diagnosis not present

## 2015-12-20 DIAGNOSIS — J3081 Allergic rhinitis due to animal (cat) (dog) hair and dander: Secondary | ICD-10-CM | POA: Diagnosis not present

## 2015-12-20 DIAGNOSIS — J3089 Other allergic rhinitis: Secondary | ICD-10-CM | POA: Diagnosis not present

## 2015-12-20 DIAGNOSIS — J301 Allergic rhinitis due to pollen: Secondary | ICD-10-CM | POA: Diagnosis not present

## 2015-12-26 DIAGNOSIS — J3089 Other allergic rhinitis: Secondary | ICD-10-CM | POA: Diagnosis not present

## 2015-12-26 DIAGNOSIS — J3081 Allergic rhinitis due to animal (cat) (dog) hair and dander: Secondary | ICD-10-CM | POA: Diagnosis not present

## 2015-12-26 DIAGNOSIS — J301 Allergic rhinitis due to pollen: Secondary | ICD-10-CM | POA: Diagnosis not present

## 2015-12-28 DIAGNOSIS — J301 Allergic rhinitis due to pollen: Secondary | ICD-10-CM | POA: Diagnosis not present

## 2015-12-28 DIAGNOSIS — J3081 Allergic rhinitis due to animal (cat) (dog) hair and dander: Secondary | ICD-10-CM | POA: Diagnosis not present

## 2015-12-28 DIAGNOSIS — J3089 Other allergic rhinitis: Secondary | ICD-10-CM | POA: Diagnosis not present

## 2016-01-09 DIAGNOSIS — J3081 Allergic rhinitis due to animal (cat) (dog) hair and dander: Secondary | ICD-10-CM | POA: Diagnosis not present

## 2016-01-09 DIAGNOSIS — J301 Allergic rhinitis due to pollen: Secondary | ICD-10-CM | POA: Diagnosis not present

## 2016-01-09 DIAGNOSIS — J3089 Other allergic rhinitis: Secondary | ICD-10-CM | POA: Diagnosis not present

## 2016-01-11 DIAGNOSIS — J3089 Other allergic rhinitis: Secondary | ICD-10-CM | POA: Diagnosis not present

## 2016-01-11 DIAGNOSIS — J301 Allergic rhinitis due to pollen: Secondary | ICD-10-CM | POA: Diagnosis not present

## 2016-01-11 DIAGNOSIS — J3081 Allergic rhinitis due to animal (cat) (dog) hair and dander: Secondary | ICD-10-CM | POA: Diagnosis not present

## 2016-01-16 DIAGNOSIS — J301 Allergic rhinitis due to pollen: Secondary | ICD-10-CM | POA: Diagnosis not present

## 2016-01-16 DIAGNOSIS — J3081 Allergic rhinitis due to animal (cat) (dog) hair and dander: Secondary | ICD-10-CM | POA: Diagnosis not present

## 2016-01-16 DIAGNOSIS — J3089 Other allergic rhinitis: Secondary | ICD-10-CM | POA: Diagnosis not present

## 2016-01-19 DIAGNOSIS — J3081 Allergic rhinitis due to animal (cat) (dog) hair and dander: Secondary | ICD-10-CM | POA: Diagnosis not present

## 2016-01-19 DIAGNOSIS — J3089 Other allergic rhinitis: Secondary | ICD-10-CM | POA: Diagnosis not present

## 2016-01-19 DIAGNOSIS — J301 Allergic rhinitis due to pollen: Secondary | ICD-10-CM | POA: Diagnosis not present

## 2016-01-24 DIAGNOSIS — J3089 Other allergic rhinitis: Secondary | ICD-10-CM | POA: Diagnosis not present

## 2016-01-24 DIAGNOSIS — J3081 Allergic rhinitis due to animal (cat) (dog) hair and dander: Secondary | ICD-10-CM | POA: Diagnosis not present

## 2016-01-24 DIAGNOSIS — J301 Allergic rhinitis due to pollen: Secondary | ICD-10-CM | POA: Diagnosis not present

## 2016-02-07 DIAGNOSIS — Z961 Presence of intraocular lens: Secondary | ICD-10-CM | POA: Diagnosis not present

## 2016-02-07 DIAGNOSIS — H33012 Retinal detachment with single break, left eye: Secondary | ICD-10-CM | POA: Diagnosis not present

## 2016-02-07 DIAGNOSIS — H401133 Primary open-angle glaucoma, bilateral, severe stage: Secondary | ICD-10-CM | POA: Diagnosis not present

## 2016-02-07 DIAGNOSIS — H26493 Other secondary cataract, bilateral: Secondary | ICD-10-CM | POA: Diagnosis not present

## 2016-02-14 DIAGNOSIS — J3081 Allergic rhinitis due to animal (cat) (dog) hair and dander: Secondary | ICD-10-CM | POA: Diagnosis not present

## 2016-02-14 DIAGNOSIS — J3089 Other allergic rhinitis: Secondary | ICD-10-CM | POA: Diagnosis not present

## 2016-02-14 DIAGNOSIS — J301 Allergic rhinitis due to pollen: Secondary | ICD-10-CM | POA: Diagnosis not present

## 2016-02-23 DIAGNOSIS — J301 Allergic rhinitis due to pollen: Secondary | ICD-10-CM | POA: Diagnosis not present

## 2016-02-23 DIAGNOSIS — J3089 Other allergic rhinitis: Secondary | ICD-10-CM | POA: Diagnosis not present

## 2016-02-23 DIAGNOSIS — J3081 Allergic rhinitis due to animal (cat) (dog) hair and dander: Secondary | ICD-10-CM | POA: Diagnosis not present

## 2016-02-27 DIAGNOSIS — J301 Allergic rhinitis due to pollen: Secondary | ICD-10-CM | POA: Diagnosis not present

## 2016-02-27 DIAGNOSIS — J3089 Other allergic rhinitis: Secondary | ICD-10-CM | POA: Diagnosis not present

## 2016-02-27 DIAGNOSIS — J3081 Allergic rhinitis due to animal (cat) (dog) hair and dander: Secondary | ICD-10-CM | POA: Diagnosis not present

## 2016-03-06 DIAGNOSIS — J301 Allergic rhinitis due to pollen: Secondary | ICD-10-CM | POA: Diagnosis not present

## 2016-03-06 DIAGNOSIS — J3081 Allergic rhinitis due to animal (cat) (dog) hair and dander: Secondary | ICD-10-CM | POA: Diagnosis not present

## 2016-03-06 DIAGNOSIS — J3089 Other allergic rhinitis: Secondary | ICD-10-CM | POA: Diagnosis not present

## 2016-03-22 DIAGNOSIS — J301 Allergic rhinitis due to pollen: Secondary | ICD-10-CM | POA: Diagnosis not present

## 2016-03-22 DIAGNOSIS — J3081 Allergic rhinitis due to animal (cat) (dog) hair and dander: Secondary | ICD-10-CM | POA: Diagnosis not present

## 2016-03-22 DIAGNOSIS — J3089 Other allergic rhinitis: Secondary | ICD-10-CM | POA: Diagnosis not present

## 2016-03-29 DIAGNOSIS — J3089 Other allergic rhinitis: Secondary | ICD-10-CM | POA: Diagnosis not present

## 2016-03-29 DIAGNOSIS — J301 Allergic rhinitis due to pollen: Secondary | ICD-10-CM | POA: Diagnosis not present

## 2016-03-29 DIAGNOSIS — J3081 Allergic rhinitis due to animal (cat) (dog) hair and dander: Secondary | ICD-10-CM | POA: Diagnosis not present

## 2016-04-02 DIAGNOSIS — J3081 Allergic rhinitis due to animal (cat) (dog) hair and dander: Secondary | ICD-10-CM | POA: Diagnosis not present

## 2016-04-02 DIAGNOSIS — J301 Allergic rhinitis due to pollen: Secondary | ICD-10-CM | POA: Diagnosis not present

## 2016-04-02 DIAGNOSIS — J3089 Other allergic rhinitis: Secondary | ICD-10-CM | POA: Diagnosis not present

## 2016-04-18 DIAGNOSIS — J301 Allergic rhinitis due to pollen: Secondary | ICD-10-CM | POA: Diagnosis not present

## 2016-04-18 DIAGNOSIS — J3089 Other allergic rhinitis: Secondary | ICD-10-CM | POA: Diagnosis not present

## 2016-04-18 DIAGNOSIS — J3081 Allergic rhinitis due to animal (cat) (dog) hair and dander: Secondary | ICD-10-CM | POA: Diagnosis not present

## 2016-04-26 DIAGNOSIS — H401132 Primary open-angle glaucoma, bilateral, moderate stage: Secondary | ICD-10-CM | POA: Diagnosis not present

## 2016-05-01 DIAGNOSIS — J3089 Other allergic rhinitis: Secondary | ICD-10-CM | POA: Diagnosis not present

## 2016-05-01 DIAGNOSIS — J3081 Allergic rhinitis due to animal (cat) (dog) hair and dander: Secondary | ICD-10-CM | POA: Diagnosis not present

## 2016-05-01 DIAGNOSIS — J301 Allergic rhinitis due to pollen: Secondary | ICD-10-CM | POA: Diagnosis not present

## 2016-05-08 DIAGNOSIS — J301 Allergic rhinitis due to pollen: Secondary | ICD-10-CM | POA: Diagnosis not present

## 2016-05-08 DIAGNOSIS — J3081 Allergic rhinitis due to animal (cat) (dog) hair and dander: Secondary | ICD-10-CM | POA: Diagnosis not present

## 2016-05-08 DIAGNOSIS — J3089 Other allergic rhinitis: Secondary | ICD-10-CM | POA: Diagnosis not present

## 2016-05-14 DIAGNOSIS — J3089 Other allergic rhinitis: Secondary | ICD-10-CM | POA: Diagnosis not present

## 2016-05-14 DIAGNOSIS — J3081 Allergic rhinitis due to animal (cat) (dog) hair and dander: Secondary | ICD-10-CM | POA: Diagnosis not present

## 2016-05-14 DIAGNOSIS — J301 Allergic rhinitis due to pollen: Secondary | ICD-10-CM | POA: Diagnosis not present

## 2016-05-15 DIAGNOSIS — J3089 Other allergic rhinitis: Secondary | ICD-10-CM | POA: Diagnosis not present

## 2016-05-15 DIAGNOSIS — J3081 Allergic rhinitis due to animal (cat) (dog) hair and dander: Secondary | ICD-10-CM | POA: Diagnosis not present

## 2016-05-15 DIAGNOSIS — J301 Allergic rhinitis due to pollen: Secondary | ICD-10-CM | POA: Diagnosis not present

## 2016-06-01 DIAGNOSIS — J301 Allergic rhinitis due to pollen: Secondary | ICD-10-CM | POA: Diagnosis not present

## 2016-06-01 DIAGNOSIS — J3081 Allergic rhinitis due to animal (cat) (dog) hair and dander: Secondary | ICD-10-CM | POA: Diagnosis not present

## 2016-06-01 DIAGNOSIS — J3089 Other allergic rhinitis: Secondary | ICD-10-CM | POA: Diagnosis not present

## 2016-06-04 DIAGNOSIS — J4530 Mild persistent asthma, uncomplicated: Secondary | ICD-10-CM | POA: Diagnosis not present

## 2016-06-04 DIAGNOSIS — J3089 Other allergic rhinitis: Secondary | ICD-10-CM | POA: Diagnosis not present

## 2016-06-04 DIAGNOSIS — J3081 Allergic rhinitis due to animal (cat) (dog) hair and dander: Secondary | ICD-10-CM | POA: Diagnosis not present

## 2016-06-04 DIAGNOSIS — J301 Allergic rhinitis due to pollen: Secondary | ICD-10-CM | POA: Diagnosis not present

## 2016-06-12 DIAGNOSIS — J3081 Allergic rhinitis due to animal (cat) (dog) hair and dander: Secondary | ICD-10-CM | POA: Diagnosis not present

## 2016-06-12 DIAGNOSIS — J301 Allergic rhinitis due to pollen: Secondary | ICD-10-CM | POA: Diagnosis not present

## 2016-06-12 DIAGNOSIS — J3089 Other allergic rhinitis: Secondary | ICD-10-CM | POA: Diagnosis not present

## 2016-06-19 DIAGNOSIS — J3081 Allergic rhinitis due to animal (cat) (dog) hair and dander: Secondary | ICD-10-CM | POA: Diagnosis not present

## 2016-06-19 DIAGNOSIS — J3089 Other allergic rhinitis: Secondary | ICD-10-CM | POA: Diagnosis not present

## 2016-06-19 DIAGNOSIS — J301 Allergic rhinitis due to pollen: Secondary | ICD-10-CM | POA: Diagnosis not present

## 2016-06-20 DIAGNOSIS — Z23 Encounter for immunization: Secondary | ICD-10-CM | POA: Diagnosis not present

## 2016-06-20 DIAGNOSIS — R7301 Impaired fasting glucose: Secondary | ICD-10-CM | POA: Diagnosis not present

## 2016-06-20 DIAGNOSIS — Z0000 Encounter for general adult medical examination without abnormal findings: Secondary | ICD-10-CM | POA: Diagnosis not present

## 2016-06-20 DIAGNOSIS — M810 Age-related osteoporosis without current pathological fracture: Secondary | ICD-10-CM | POA: Diagnosis not present

## 2016-06-26 DIAGNOSIS — J3081 Allergic rhinitis due to animal (cat) (dog) hair and dander: Secondary | ICD-10-CM | POA: Diagnosis not present

## 2016-06-26 DIAGNOSIS — J3089 Other allergic rhinitis: Secondary | ICD-10-CM | POA: Diagnosis not present

## 2016-06-26 DIAGNOSIS — J301 Allergic rhinitis due to pollen: Secondary | ICD-10-CM | POA: Diagnosis not present

## 2016-07-02 DIAGNOSIS — J3081 Allergic rhinitis due to animal (cat) (dog) hair and dander: Secondary | ICD-10-CM | POA: Diagnosis not present

## 2016-07-02 DIAGNOSIS — J3089 Other allergic rhinitis: Secondary | ICD-10-CM | POA: Diagnosis not present

## 2016-07-02 DIAGNOSIS — J301 Allergic rhinitis due to pollen: Secondary | ICD-10-CM | POA: Diagnosis not present

## 2016-07-13 DIAGNOSIS — J3089 Other allergic rhinitis: Secondary | ICD-10-CM | POA: Diagnosis not present

## 2016-07-13 DIAGNOSIS — J301 Allergic rhinitis due to pollen: Secondary | ICD-10-CM | POA: Diagnosis not present

## 2016-07-13 DIAGNOSIS — J3081 Allergic rhinitis due to animal (cat) (dog) hair and dander: Secondary | ICD-10-CM | POA: Diagnosis not present

## 2016-07-17 DIAGNOSIS — H401133 Primary open-angle glaucoma, bilateral, severe stage: Secondary | ICD-10-CM | POA: Diagnosis not present

## 2016-07-17 DIAGNOSIS — H33012 Retinal detachment with single break, left eye: Secondary | ICD-10-CM | POA: Diagnosis not present

## 2016-07-17 DIAGNOSIS — H26493 Other secondary cataract, bilateral: Secondary | ICD-10-CM | POA: Diagnosis not present

## 2016-07-18 ENCOUNTER — Other Ambulatory Visit: Payer: Self-pay | Admitting: Physician Assistant

## 2016-07-18 DIAGNOSIS — D239 Other benign neoplasm of skin, unspecified: Secondary | ICD-10-CM | POA: Diagnosis not present

## 2016-07-18 DIAGNOSIS — D0439 Carcinoma in situ of skin of other parts of face: Secondary | ICD-10-CM | POA: Diagnosis not present

## 2016-07-18 DIAGNOSIS — D225 Melanocytic nevi of trunk: Secondary | ICD-10-CM | POA: Diagnosis not present

## 2016-07-18 DIAGNOSIS — J3089 Other allergic rhinitis: Secondary | ICD-10-CM | POA: Diagnosis not present

## 2016-07-18 DIAGNOSIS — B009 Herpesviral infection, unspecified: Secondary | ICD-10-CM | POA: Diagnosis not present

## 2016-07-18 DIAGNOSIS — J3081 Allergic rhinitis due to animal (cat) (dog) hair and dander: Secondary | ICD-10-CM | POA: Diagnosis not present

## 2016-07-18 DIAGNOSIS — J301 Allergic rhinitis due to pollen: Secondary | ICD-10-CM | POA: Diagnosis not present

## 2016-07-18 DIAGNOSIS — D485 Neoplasm of uncertain behavior of skin: Secondary | ICD-10-CM | POA: Diagnosis not present

## 2016-07-23 DIAGNOSIS — J3081 Allergic rhinitis due to animal (cat) (dog) hair and dander: Secondary | ICD-10-CM | POA: Diagnosis not present

## 2016-07-23 DIAGNOSIS — J301 Allergic rhinitis due to pollen: Secondary | ICD-10-CM | POA: Diagnosis not present

## 2016-07-23 DIAGNOSIS — J3089 Other allergic rhinitis: Secondary | ICD-10-CM | POA: Diagnosis not present

## 2016-07-24 DIAGNOSIS — H9313 Tinnitus, bilateral: Secondary | ICD-10-CM | POA: Diagnosis not present

## 2016-07-24 DIAGNOSIS — H903 Sensorineural hearing loss, bilateral: Secondary | ICD-10-CM | POA: Diagnosis not present

## 2016-07-24 DIAGNOSIS — H6982 Other specified disorders of Eustachian tube, left ear: Secondary | ICD-10-CM | POA: Diagnosis not present

## 2016-07-26 DIAGNOSIS — J3081 Allergic rhinitis due to animal (cat) (dog) hair and dander: Secondary | ICD-10-CM | POA: Diagnosis not present

## 2016-07-26 DIAGNOSIS — J301 Allergic rhinitis due to pollen: Secondary | ICD-10-CM | POA: Diagnosis not present

## 2016-07-26 DIAGNOSIS — J3089 Other allergic rhinitis: Secondary | ICD-10-CM | POA: Diagnosis not present

## 2016-07-30 DIAGNOSIS — J301 Allergic rhinitis due to pollen: Secondary | ICD-10-CM | POA: Diagnosis not present

## 2016-07-30 DIAGNOSIS — J3089 Other allergic rhinitis: Secondary | ICD-10-CM | POA: Diagnosis not present

## 2016-07-30 DIAGNOSIS — J3081 Allergic rhinitis due to animal (cat) (dog) hair and dander: Secondary | ICD-10-CM | POA: Diagnosis not present

## 2016-08-09 DIAGNOSIS — J3081 Allergic rhinitis due to animal (cat) (dog) hair and dander: Secondary | ICD-10-CM | POA: Diagnosis not present

## 2016-08-09 DIAGNOSIS — J3089 Other allergic rhinitis: Secondary | ICD-10-CM | POA: Diagnosis not present

## 2016-08-09 DIAGNOSIS — J301 Allergic rhinitis due to pollen: Secondary | ICD-10-CM | POA: Diagnosis not present

## 2016-08-20 DIAGNOSIS — J3089 Other allergic rhinitis: Secondary | ICD-10-CM | POA: Diagnosis not present

## 2016-08-20 DIAGNOSIS — J301 Allergic rhinitis due to pollen: Secondary | ICD-10-CM | POA: Diagnosis not present

## 2016-08-20 DIAGNOSIS — J3081 Allergic rhinitis due to animal (cat) (dog) hair and dander: Secondary | ICD-10-CM | POA: Diagnosis not present

## 2016-08-23 DIAGNOSIS — D0439 Carcinoma in situ of skin of other parts of face: Secondary | ICD-10-CM | POA: Diagnosis not present

## 2016-08-28 DIAGNOSIS — H401132 Primary open-angle glaucoma, bilateral, moderate stage: Secondary | ICD-10-CM | POA: Diagnosis not present

## 2016-08-30 DIAGNOSIS — Z6823 Body mass index (BMI) 23.0-23.9, adult: Secondary | ICD-10-CM | POA: Diagnosis not present

## 2016-08-30 DIAGNOSIS — Z1272 Encounter for screening for malignant neoplasm of vagina: Secondary | ICD-10-CM | POA: Diagnosis not present

## 2016-08-30 DIAGNOSIS — Z124 Encounter for screening for malignant neoplasm of cervix: Secondary | ICD-10-CM | POA: Diagnosis not present

## 2016-08-30 DIAGNOSIS — Z90710 Acquired absence of both cervix and uterus: Secondary | ICD-10-CM | POA: Diagnosis not present

## 2016-08-31 DIAGNOSIS — Z1231 Encounter for screening mammogram for malignant neoplasm of breast: Secondary | ICD-10-CM | POA: Diagnosis not present

## 2016-08-31 DIAGNOSIS — J3089 Other allergic rhinitis: Secondary | ICD-10-CM | POA: Diagnosis not present

## 2016-08-31 DIAGNOSIS — J301 Allergic rhinitis due to pollen: Secondary | ICD-10-CM | POA: Diagnosis not present

## 2016-08-31 DIAGNOSIS — J3081 Allergic rhinitis due to animal (cat) (dog) hair and dander: Secondary | ICD-10-CM | POA: Diagnosis not present

## 2016-09-06 DIAGNOSIS — J3081 Allergic rhinitis due to animal (cat) (dog) hair and dander: Secondary | ICD-10-CM | POA: Diagnosis not present

## 2016-09-06 DIAGNOSIS — J301 Allergic rhinitis due to pollen: Secondary | ICD-10-CM | POA: Diagnosis not present

## 2016-09-06 DIAGNOSIS — J3089 Other allergic rhinitis: Secondary | ICD-10-CM | POA: Diagnosis not present

## 2016-09-13 DIAGNOSIS — J3089 Other allergic rhinitis: Secondary | ICD-10-CM | POA: Diagnosis not present

## 2016-09-13 DIAGNOSIS — J3081 Allergic rhinitis due to animal (cat) (dog) hair and dander: Secondary | ICD-10-CM | POA: Diagnosis not present

## 2016-09-13 DIAGNOSIS — J301 Allergic rhinitis due to pollen: Secondary | ICD-10-CM | POA: Diagnosis not present

## 2016-09-28 DIAGNOSIS — J3081 Allergic rhinitis due to animal (cat) (dog) hair and dander: Secondary | ICD-10-CM | POA: Diagnosis not present

## 2016-09-28 DIAGNOSIS — J3089 Other allergic rhinitis: Secondary | ICD-10-CM | POA: Diagnosis not present

## 2016-09-28 DIAGNOSIS — J301 Allergic rhinitis due to pollen: Secondary | ICD-10-CM | POA: Diagnosis not present

## 2016-10-09 DIAGNOSIS — J3081 Allergic rhinitis due to animal (cat) (dog) hair and dander: Secondary | ICD-10-CM | POA: Diagnosis not present

## 2016-10-09 DIAGNOSIS — J301 Allergic rhinitis due to pollen: Secondary | ICD-10-CM | POA: Diagnosis not present

## 2016-10-09 DIAGNOSIS — J3089 Other allergic rhinitis: Secondary | ICD-10-CM | POA: Diagnosis not present

## 2016-10-24 DIAGNOSIS — J301 Allergic rhinitis due to pollen: Secondary | ICD-10-CM | POA: Diagnosis not present

## 2016-10-24 DIAGNOSIS — J3089 Other allergic rhinitis: Secondary | ICD-10-CM | POA: Diagnosis not present

## 2016-10-24 DIAGNOSIS — J3081 Allergic rhinitis due to animal (cat) (dog) hair and dander: Secondary | ICD-10-CM | POA: Diagnosis not present

## 2016-11-09 DIAGNOSIS — J3089 Other allergic rhinitis: Secondary | ICD-10-CM | POA: Diagnosis not present

## 2016-11-09 DIAGNOSIS — J301 Allergic rhinitis due to pollen: Secondary | ICD-10-CM | POA: Diagnosis not present

## 2016-11-09 DIAGNOSIS — J3081 Allergic rhinitis due to animal (cat) (dog) hair and dander: Secondary | ICD-10-CM | POA: Diagnosis not present

## 2016-11-20 DIAGNOSIS — J3081 Allergic rhinitis due to animal (cat) (dog) hair and dander: Secondary | ICD-10-CM | POA: Diagnosis not present

## 2016-11-20 DIAGNOSIS — J301 Allergic rhinitis due to pollen: Secondary | ICD-10-CM | POA: Diagnosis not present

## 2016-11-20 DIAGNOSIS — J3089 Other allergic rhinitis: Secondary | ICD-10-CM | POA: Diagnosis not present

## 2016-11-21 DIAGNOSIS — Z85828 Personal history of other malignant neoplasm of skin: Secondary | ICD-10-CM | POA: Diagnosis not present

## 2016-11-21 DIAGNOSIS — D229 Melanocytic nevi, unspecified: Secondary | ICD-10-CM | POA: Diagnosis not present

## 2016-11-21 DIAGNOSIS — L821 Other seborrheic keratosis: Secondary | ICD-10-CM | POA: Diagnosis not present

## 2016-12-10 DIAGNOSIS — J3081 Allergic rhinitis due to animal (cat) (dog) hair and dander: Secondary | ICD-10-CM | POA: Diagnosis not present

## 2016-12-10 DIAGNOSIS — J301 Allergic rhinitis due to pollen: Secondary | ICD-10-CM | POA: Diagnosis not present

## 2016-12-10 DIAGNOSIS — J3089 Other allergic rhinitis: Secondary | ICD-10-CM | POA: Diagnosis not present

## 2016-12-24 DIAGNOSIS — J301 Allergic rhinitis due to pollen: Secondary | ICD-10-CM | POA: Diagnosis not present

## 2016-12-24 DIAGNOSIS — J3081 Allergic rhinitis due to animal (cat) (dog) hair and dander: Secondary | ICD-10-CM | POA: Diagnosis not present

## 2016-12-24 DIAGNOSIS — J3089 Other allergic rhinitis: Secondary | ICD-10-CM | POA: Diagnosis not present

## 2017-01-11 DIAGNOSIS — J3089 Other allergic rhinitis: Secondary | ICD-10-CM | POA: Diagnosis not present

## 2017-01-11 DIAGNOSIS — J301 Allergic rhinitis due to pollen: Secondary | ICD-10-CM | POA: Diagnosis not present

## 2017-01-11 DIAGNOSIS — J3081 Allergic rhinitis due to animal (cat) (dog) hair and dander: Secondary | ICD-10-CM | POA: Diagnosis not present

## 2017-01-28 DIAGNOSIS — J301 Allergic rhinitis due to pollen: Secondary | ICD-10-CM | POA: Diagnosis not present

## 2017-01-28 DIAGNOSIS — J3089 Other allergic rhinitis: Secondary | ICD-10-CM | POA: Diagnosis not present

## 2017-01-28 DIAGNOSIS — J3081 Allergic rhinitis due to animal (cat) (dog) hair and dander: Secondary | ICD-10-CM | POA: Diagnosis not present

## 2017-02-04 DIAGNOSIS — J301 Allergic rhinitis due to pollen: Secondary | ICD-10-CM | POA: Diagnosis not present

## 2017-02-04 DIAGNOSIS — J3081 Allergic rhinitis due to animal (cat) (dog) hair and dander: Secondary | ICD-10-CM | POA: Diagnosis not present

## 2017-02-04 DIAGNOSIS — J3089 Other allergic rhinitis: Secondary | ICD-10-CM | POA: Diagnosis not present

## 2017-02-15 DIAGNOSIS — J3081 Allergic rhinitis due to animal (cat) (dog) hair and dander: Secondary | ICD-10-CM | POA: Diagnosis not present

## 2017-02-15 DIAGNOSIS — J3089 Other allergic rhinitis: Secondary | ICD-10-CM | POA: Diagnosis not present

## 2017-02-15 DIAGNOSIS — J301 Allergic rhinitis due to pollen: Secondary | ICD-10-CM | POA: Diagnosis not present

## 2017-02-27 DIAGNOSIS — J301 Allergic rhinitis due to pollen: Secondary | ICD-10-CM | POA: Diagnosis not present

## 2017-02-27 DIAGNOSIS — J3089 Other allergic rhinitis: Secondary | ICD-10-CM | POA: Diagnosis not present

## 2017-02-27 DIAGNOSIS — J3081 Allergic rhinitis due to animal (cat) (dog) hair and dander: Secondary | ICD-10-CM | POA: Diagnosis not present

## 2017-02-28 DIAGNOSIS — H401132 Primary open-angle glaucoma, bilateral, moderate stage: Secondary | ICD-10-CM | POA: Diagnosis not present

## 2017-02-28 DIAGNOSIS — H43812 Vitreous degeneration, left eye: Secondary | ICD-10-CM | POA: Diagnosis not present

## 2017-02-28 DIAGNOSIS — H31002 Unspecified chorioretinal scars, left eye: Secondary | ICD-10-CM | POA: Diagnosis not present

## 2017-02-28 DIAGNOSIS — H04123 Dry eye syndrome of bilateral lacrimal glands: Secondary | ICD-10-CM | POA: Diagnosis not present

## 2017-03-11 DIAGNOSIS — H401133 Primary open-angle glaucoma, bilateral, severe stage: Secondary | ICD-10-CM | POA: Diagnosis not present

## 2017-03-11 DIAGNOSIS — Z961 Presence of intraocular lens: Secondary | ICD-10-CM | POA: Diagnosis not present

## 2017-03-11 DIAGNOSIS — H26493 Other secondary cataract, bilateral: Secondary | ICD-10-CM | POA: Diagnosis not present

## 2017-03-11 DIAGNOSIS — H33012 Retinal detachment with single break, left eye: Secondary | ICD-10-CM | POA: Diagnosis not present

## 2017-03-18 DIAGNOSIS — J3081 Allergic rhinitis due to animal (cat) (dog) hair and dander: Secondary | ICD-10-CM | POA: Diagnosis not present

## 2017-03-18 DIAGNOSIS — J3089 Other allergic rhinitis: Secondary | ICD-10-CM | POA: Diagnosis not present

## 2017-03-18 DIAGNOSIS — J301 Allergic rhinitis due to pollen: Secondary | ICD-10-CM | POA: Diagnosis not present

## 2017-04-03 DIAGNOSIS — J301 Allergic rhinitis due to pollen: Secondary | ICD-10-CM | POA: Diagnosis not present

## 2017-04-03 DIAGNOSIS — J3081 Allergic rhinitis due to animal (cat) (dog) hair and dander: Secondary | ICD-10-CM | POA: Diagnosis not present

## 2017-04-03 DIAGNOSIS — J3089 Other allergic rhinitis: Secondary | ICD-10-CM | POA: Diagnosis not present

## 2017-04-24 DIAGNOSIS — J3089 Other allergic rhinitis: Secondary | ICD-10-CM | POA: Diagnosis not present

## 2017-04-24 DIAGNOSIS — J301 Allergic rhinitis due to pollen: Secondary | ICD-10-CM | POA: Diagnosis not present

## 2017-04-24 DIAGNOSIS — J3081 Allergic rhinitis due to animal (cat) (dog) hair and dander: Secondary | ICD-10-CM | POA: Diagnosis not present

## 2017-04-26 DIAGNOSIS — J3089 Other allergic rhinitis: Secondary | ICD-10-CM | POA: Diagnosis not present

## 2017-04-26 DIAGNOSIS — J3081 Allergic rhinitis due to animal (cat) (dog) hair and dander: Secondary | ICD-10-CM | POA: Diagnosis not present

## 2017-04-26 DIAGNOSIS — J301 Allergic rhinitis due to pollen: Secondary | ICD-10-CM | POA: Diagnosis not present

## 2017-04-30 DIAGNOSIS — J3089 Other allergic rhinitis: Secondary | ICD-10-CM | POA: Diagnosis not present

## 2017-04-30 DIAGNOSIS — J3081 Allergic rhinitis due to animal (cat) (dog) hair and dander: Secondary | ICD-10-CM | POA: Diagnosis not present

## 2017-04-30 DIAGNOSIS — J301 Allergic rhinitis due to pollen: Secondary | ICD-10-CM | POA: Diagnosis not present

## 2017-05-03 DIAGNOSIS — J301 Allergic rhinitis due to pollen: Secondary | ICD-10-CM | POA: Diagnosis not present

## 2017-05-03 DIAGNOSIS — J3081 Allergic rhinitis due to animal (cat) (dog) hair and dander: Secondary | ICD-10-CM | POA: Diagnosis not present

## 2017-05-03 DIAGNOSIS — J3089 Other allergic rhinitis: Secondary | ICD-10-CM | POA: Diagnosis not present

## 2017-05-06 DIAGNOSIS — J301 Allergic rhinitis due to pollen: Secondary | ICD-10-CM | POA: Diagnosis not present

## 2017-05-06 DIAGNOSIS — J3089 Other allergic rhinitis: Secondary | ICD-10-CM | POA: Diagnosis not present

## 2017-05-06 DIAGNOSIS — J3081 Allergic rhinitis due to animal (cat) (dog) hair and dander: Secondary | ICD-10-CM | POA: Diagnosis not present

## 2017-05-23 DIAGNOSIS — J3089 Other allergic rhinitis: Secondary | ICD-10-CM | POA: Diagnosis not present

## 2017-05-23 DIAGNOSIS — J3081 Allergic rhinitis due to animal (cat) (dog) hair and dander: Secondary | ICD-10-CM | POA: Diagnosis not present

## 2017-05-23 DIAGNOSIS — J301 Allergic rhinitis due to pollen: Secondary | ICD-10-CM | POA: Diagnosis not present

## 2017-06-04 DIAGNOSIS — J301 Allergic rhinitis due to pollen: Secondary | ICD-10-CM | POA: Diagnosis not present

## 2017-06-04 DIAGNOSIS — J3089 Other allergic rhinitis: Secondary | ICD-10-CM | POA: Diagnosis not present

## 2017-06-04 DIAGNOSIS — J4530 Mild persistent asthma, uncomplicated: Secondary | ICD-10-CM | POA: Diagnosis not present

## 2017-06-04 DIAGNOSIS — J3081 Allergic rhinitis due to animal (cat) (dog) hair and dander: Secondary | ICD-10-CM | POA: Diagnosis not present

## 2017-07-02 DIAGNOSIS — J3089 Other allergic rhinitis: Secondary | ICD-10-CM | POA: Diagnosis not present

## 2017-07-02 DIAGNOSIS — J3081 Allergic rhinitis due to animal (cat) (dog) hair and dander: Secondary | ICD-10-CM | POA: Diagnosis not present

## 2017-07-02 DIAGNOSIS — J301 Allergic rhinitis due to pollen: Secondary | ICD-10-CM | POA: Diagnosis not present

## 2017-07-29 DIAGNOSIS — J3081 Allergic rhinitis due to animal (cat) (dog) hair and dander: Secondary | ICD-10-CM | POA: Diagnosis not present

## 2017-07-29 DIAGNOSIS — J3089 Other allergic rhinitis: Secondary | ICD-10-CM | POA: Diagnosis not present

## 2017-07-29 DIAGNOSIS — J301 Allergic rhinitis due to pollen: Secondary | ICD-10-CM | POA: Diagnosis not present

## 2017-08-09 DIAGNOSIS — E785 Hyperlipidemia, unspecified: Secondary | ICD-10-CM | POA: Diagnosis not present

## 2017-08-09 DIAGNOSIS — H409 Unspecified glaucoma: Secondary | ICD-10-CM | POA: Diagnosis not present

## 2017-08-09 DIAGNOSIS — M810 Age-related osteoporosis without current pathological fracture: Secondary | ICD-10-CM | POA: Diagnosis not present

## 2017-08-12 ENCOUNTER — Other Ambulatory Visit: Payer: Self-pay | Admitting: Family Medicine

## 2017-08-12 DIAGNOSIS — M810 Age-related osteoporosis without current pathological fracture: Secondary | ICD-10-CM

## 2017-08-12 DIAGNOSIS — Z1231 Encounter for screening mammogram for malignant neoplasm of breast: Secondary | ICD-10-CM

## 2017-08-21 DIAGNOSIS — H6121 Impacted cerumen, right ear: Secondary | ICD-10-CM | POA: Diagnosis not present

## 2017-08-21 DIAGNOSIS — H903 Sensorineural hearing loss, bilateral: Secondary | ICD-10-CM | POA: Diagnosis not present

## 2017-08-21 DIAGNOSIS — H9313 Tinnitus, bilateral: Secondary | ICD-10-CM | POA: Diagnosis not present

## 2017-08-27 DIAGNOSIS — H401123 Primary open-angle glaucoma, left eye, severe stage: Secondary | ICD-10-CM | POA: Diagnosis not present

## 2017-08-27 DIAGNOSIS — H04123 Dry eye syndrome of bilateral lacrimal glands: Secondary | ICD-10-CM | POA: Diagnosis not present

## 2017-08-27 DIAGNOSIS — H31002 Unspecified chorioretinal scars, left eye: Secondary | ICD-10-CM | POA: Diagnosis not present

## 2017-08-27 DIAGNOSIS — H401112 Primary open-angle glaucoma, right eye, moderate stage: Secondary | ICD-10-CM | POA: Diagnosis not present

## 2017-08-30 DIAGNOSIS — J3081 Allergic rhinitis due to animal (cat) (dog) hair and dander: Secondary | ICD-10-CM | POA: Diagnosis not present

## 2017-08-30 DIAGNOSIS — J3089 Other allergic rhinitis: Secondary | ICD-10-CM | POA: Diagnosis not present

## 2017-08-30 DIAGNOSIS — J301 Allergic rhinitis due to pollen: Secondary | ICD-10-CM | POA: Diagnosis not present

## 2017-10-16 DIAGNOSIS — J3081 Allergic rhinitis due to animal (cat) (dog) hair and dander: Secondary | ICD-10-CM | POA: Diagnosis not present

## 2017-10-16 DIAGNOSIS — J301 Allergic rhinitis due to pollen: Secondary | ICD-10-CM | POA: Diagnosis not present

## 2017-10-16 DIAGNOSIS — J3089 Other allergic rhinitis: Secondary | ICD-10-CM | POA: Diagnosis not present

## 2017-10-29 DIAGNOSIS — Z01419 Encounter for gynecological examination (general) (routine) without abnormal findings: Secondary | ICD-10-CM | POA: Diagnosis not present

## 2017-10-29 DIAGNOSIS — Z1231 Encounter for screening mammogram for malignant neoplasm of breast: Secondary | ICD-10-CM | POA: Diagnosis not present

## 2017-10-29 DIAGNOSIS — Z6822 Body mass index (BMI) 22.0-22.9, adult: Secondary | ICD-10-CM | POA: Diagnosis not present

## 2017-10-31 ENCOUNTER — Ambulatory Visit
Admission: RE | Admit: 2017-10-31 | Discharge: 2017-10-31 | Disposition: A | Discharge status: Home / Self Care | Payer: Commercial Managed Care - HMO | Source: Ambulatory Visit | Attending: Family Medicine | Admitting: Family Medicine

## 2017-10-31 DIAGNOSIS — Z780 Asymptomatic menopausal state: Secondary | ICD-10-CM | POA: Diagnosis not present

## 2017-10-31 DIAGNOSIS — M810 Age-related osteoporosis without current pathological fracture: Secondary | ICD-10-CM

## 2017-11-07 DIAGNOSIS — J301 Allergic rhinitis due to pollen: Secondary | ICD-10-CM | POA: Diagnosis not present

## 2017-11-07 DIAGNOSIS — J3089 Other allergic rhinitis: Secondary | ICD-10-CM | POA: Diagnosis not present

## 2017-11-07 DIAGNOSIS — J3081 Allergic rhinitis due to animal (cat) (dog) hair and dander: Secondary | ICD-10-CM | POA: Diagnosis not present

## 2017-12-12 DIAGNOSIS — J301 Allergic rhinitis due to pollen: Secondary | ICD-10-CM | POA: Diagnosis not present

## 2017-12-12 DIAGNOSIS — J3089 Other allergic rhinitis: Secondary | ICD-10-CM | POA: Diagnosis not present

## 2017-12-12 DIAGNOSIS — J3081 Allergic rhinitis due to animal (cat) (dog) hair and dander: Secondary | ICD-10-CM | POA: Diagnosis not present

## 2017-12-26 DIAGNOSIS — H401123 Primary open-angle glaucoma, left eye, severe stage: Secondary | ICD-10-CM | POA: Diagnosis not present

## 2018-01-08 DIAGNOSIS — J3081 Allergic rhinitis due to animal (cat) (dog) hair and dander: Secondary | ICD-10-CM | POA: Diagnosis not present

## 2018-01-08 DIAGNOSIS — J301 Allergic rhinitis due to pollen: Secondary | ICD-10-CM | POA: Diagnosis not present

## 2018-01-08 DIAGNOSIS — J3089 Other allergic rhinitis: Secondary | ICD-10-CM | POA: Diagnosis not present

## 2018-02-06 DIAGNOSIS — J3089 Other allergic rhinitis: Secondary | ICD-10-CM | POA: Diagnosis not present

## 2018-02-06 DIAGNOSIS — J301 Allergic rhinitis due to pollen: Secondary | ICD-10-CM | POA: Diagnosis not present

## 2018-02-06 DIAGNOSIS — J3081 Allergic rhinitis due to animal (cat) (dog) hair and dander: Secondary | ICD-10-CM | POA: Diagnosis not present

## 2018-03-07 DIAGNOSIS — J3081 Allergic rhinitis due to animal (cat) (dog) hair and dander: Secondary | ICD-10-CM | POA: Diagnosis not present

## 2018-03-07 DIAGNOSIS — J301 Allergic rhinitis due to pollen: Secondary | ICD-10-CM | POA: Diagnosis not present

## 2018-03-07 DIAGNOSIS — J3089 Other allergic rhinitis: Secondary | ICD-10-CM | POA: Diagnosis not present

## 2018-03-18 DIAGNOSIS — Z961 Presence of intraocular lens: Secondary | ICD-10-CM | POA: Diagnosis not present

## 2018-03-18 DIAGNOSIS — H33012 Retinal detachment with single break, left eye: Secondary | ICD-10-CM | POA: Diagnosis not present

## 2018-03-18 DIAGNOSIS — H401133 Primary open-angle glaucoma, bilateral, severe stage: Secondary | ICD-10-CM | POA: Diagnosis not present

## 2018-04-03 DIAGNOSIS — H401123 Primary open-angle glaucoma, left eye, severe stage: Secondary | ICD-10-CM | POA: Diagnosis not present

## 2018-04-03 DIAGNOSIS — H338 Other retinal detachments: Secondary | ICD-10-CM | POA: Diagnosis not present

## 2018-04-03 DIAGNOSIS — H59032 Cystoid macular edema following cataract surgery, left eye: Secondary | ICD-10-CM | POA: Diagnosis not present

## 2018-04-10 DIAGNOSIS — J301 Allergic rhinitis due to pollen: Secondary | ICD-10-CM | POA: Diagnosis not present

## 2018-04-10 DIAGNOSIS — J3089 Other allergic rhinitis: Secondary | ICD-10-CM | POA: Diagnosis not present

## 2018-04-10 DIAGNOSIS — J3081 Allergic rhinitis due to animal (cat) (dog) hair and dander: Secondary | ICD-10-CM | POA: Diagnosis not present

## 2018-04-18 DIAGNOSIS — J301 Allergic rhinitis due to pollen: Secondary | ICD-10-CM | POA: Diagnosis not present

## 2018-04-18 DIAGNOSIS — J3089 Other allergic rhinitis: Secondary | ICD-10-CM | POA: Diagnosis not present

## 2018-04-18 DIAGNOSIS — J3081 Allergic rhinitis due to animal (cat) (dog) hair and dander: Secondary | ICD-10-CM | POA: Diagnosis not present

## 2018-05-13 DIAGNOSIS — J301 Allergic rhinitis due to pollen: Secondary | ICD-10-CM | POA: Diagnosis not present

## 2018-05-13 DIAGNOSIS — J3089 Other allergic rhinitis: Secondary | ICD-10-CM | POA: Diagnosis not present

## 2018-05-13 DIAGNOSIS — J3081 Allergic rhinitis due to animal (cat) (dog) hair and dander: Secondary | ICD-10-CM | POA: Diagnosis not present

## 2018-05-19 DIAGNOSIS — J301 Allergic rhinitis due to pollen: Secondary | ICD-10-CM | POA: Diagnosis not present

## 2018-05-19 DIAGNOSIS — J3081 Allergic rhinitis due to animal (cat) (dog) hair and dander: Secondary | ICD-10-CM | POA: Diagnosis not present

## 2018-05-19 DIAGNOSIS — J3089 Other allergic rhinitis: Secondary | ICD-10-CM | POA: Diagnosis not present

## 2018-05-29 DIAGNOSIS — J3089 Other allergic rhinitis: Secondary | ICD-10-CM | POA: Diagnosis not present

## 2018-05-29 DIAGNOSIS — J3081 Allergic rhinitis due to animal (cat) (dog) hair and dander: Secondary | ICD-10-CM | POA: Diagnosis not present

## 2018-05-29 DIAGNOSIS — J301 Allergic rhinitis due to pollen: Secondary | ICD-10-CM | POA: Diagnosis not present

## 2018-06-04 DIAGNOSIS — J301 Allergic rhinitis due to pollen: Secondary | ICD-10-CM | POA: Diagnosis not present

## 2018-06-04 DIAGNOSIS — J4530 Mild persistent asthma, uncomplicated: Secondary | ICD-10-CM | POA: Diagnosis not present

## 2018-06-04 DIAGNOSIS — J3089 Other allergic rhinitis: Secondary | ICD-10-CM | POA: Diagnosis not present

## 2018-06-04 DIAGNOSIS — J3081 Allergic rhinitis due to animal (cat) (dog) hair and dander: Secondary | ICD-10-CM | POA: Diagnosis not present

## 2018-06-10 DIAGNOSIS — J3081 Allergic rhinitis due to animal (cat) (dog) hair and dander: Secondary | ICD-10-CM | POA: Diagnosis not present

## 2018-06-10 DIAGNOSIS — J3089 Other allergic rhinitis: Secondary | ICD-10-CM | POA: Diagnosis not present

## 2018-06-10 DIAGNOSIS — J301 Allergic rhinitis due to pollen: Secondary | ICD-10-CM | POA: Diagnosis not present

## 2018-06-17 DIAGNOSIS — J3089 Other allergic rhinitis: Secondary | ICD-10-CM | POA: Diagnosis not present

## 2018-06-17 DIAGNOSIS — J3081 Allergic rhinitis due to animal (cat) (dog) hair and dander: Secondary | ICD-10-CM | POA: Diagnosis not present

## 2018-06-17 DIAGNOSIS — J301 Allergic rhinitis due to pollen: Secondary | ICD-10-CM | POA: Diagnosis not present

## 2018-06-20 DIAGNOSIS — J301 Allergic rhinitis due to pollen: Secondary | ICD-10-CM | POA: Diagnosis not present

## 2018-06-20 DIAGNOSIS — J3081 Allergic rhinitis due to animal (cat) (dog) hair and dander: Secondary | ICD-10-CM | POA: Diagnosis not present

## 2018-06-20 DIAGNOSIS — J3089 Other allergic rhinitis: Secondary | ICD-10-CM | POA: Diagnosis not present

## 2018-06-24 DIAGNOSIS — J301 Allergic rhinitis due to pollen: Secondary | ICD-10-CM | POA: Diagnosis not present

## 2018-06-24 DIAGNOSIS — J3089 Other allergic rhinitis: Secondary | ICD-10-CM | POA: Diagnosis not present

## 2018-06-24 DIAGNOSIS — J3081 Allergic rhinitis due to animal (cat) (dog) hair and dander: Secondary | ICD-10-CM | POA: Diagnosis not present

## 2018-06-27 DIAGNOSIS — J301 Allergic rhinitis due to pollen: Secondary | ICD-10-CM | POA: Diagnosis not present

## 2018-06-27 DIAGNOSIS — J3081 Allergic rhinitis due to animal (cat) (dog) hair and dander: Secondary | ICD-10-CM | POA: Diagnosis not present

## 2018-06-27 DIAGNOSIS — J3089 Other allergic rhinitis: Secondary | ICD-10-CM | POA: Diagnosis not present

## 2018-07-22 DIAGNOSIS — H33012 Retinal detachment with single break, left eye: Secondary | ICD-10-CM | POA: Diagnosis not present

## 2018-07-22 DIAGNOSIS — Z961 Presence of intraocular lens: Secondary | ICD-10-CM | POA: Diagnosis not present

## 2018-07-22 DIAGNOSIS — H401133 Primary open-angle glaucoma, bilateral, severe stage: Secondary | ICD-10-CM | POA: Diagnosis not present

## 2018-07-22 DIAGNOSIS — H35352 Cystoid macular degeneration, left eye: Secondary | ICD-10-CM | POA: Diagnosis not present

## 2018-07-25 DIAGNOSIS — J301 Allergic rhinitis due to pollen: Secondary | ICD-10-CM | POA: Diagnosis not present

## 2018-07-25 DIAGNOSIS — J3089 Other allergic rhinitis: Secondary | ICD-10-CM | POA: Diagnosis not present

## 2018-07-25 DIAGNOSIS — J3081 Allergic rhinitis due to animal (cat) (dog) hair and dander: Secondary | ICD-10-CM | POA: Diagnosis not present

## 2018-08-26 DIAGNOSIS — J3081 Allergic rhinitis due to animal (cat) (dog) hair and dander: Secondary | ICD-10-CM | POA: Diagnosis not present

## 2018-08-26 DIAGNOSIS — J3089 Other allergic rhinitis: Secondary | ICD-10-CM | POA: Diagnosis not present

## 2018-08-26 DIAGNOSIS — J301 Allergic rhinitis due to pollen: Secondary | ICD-10-CM | POA: Diagnosis not present

## 2018-08-28 DIAGNOSIS — H401123 Primary open-angle glaucoma, left eye, severe stage: Secondary | ICD-10-CM | POA: Diagnosis not present

## 2018-08-28 DIAGNOSIS — H338 Other retinal detachments: Secondary | ICD-10-CM | POA: Diagnosis not present

## 2018-09-29 DIAGNOSIS — J3089 Other allergic rhinitis: Secondary | ICD-10-CM | POA: Diagnosis not present

## 2018-09-29 DIAGNOSIS — J3081 Allergic rhinitis due to animal (cat) (dog) hair and dander: Secondary | ICD-10-CM | POA: Diagnosis not present

## 2018-09-29 DIAGNOSIS — J301 Allergic rhinitis due to pollen: Secondary | ICD-10-CM | POA: Diagnosis not present

## 2018-10-07 DIAGNOSIS — H903 Sensorineural hearing loss, bilateral: Secondary | ICD-10-CM | POA: Diagnosis not present

## 2018-10-07 DIAGNOSIS — H9313 Tinnitus, bilateral: Secondary | ICD-10-CM | POA: Diagnosis not present

## 2018-11-03 DIAGNOSIS — J3081 Allergic rhinitis due to animal (cat) (dog) hair and dander: Secondary | ICD-10-CM | POA: Diagnosis not present

## 2018-11-03 DIAGNOSIS — J3089 Other allergic rhinitis: Secondary | ICD-10-CM | POA: Diagnosis not present

## 2018-11-03 DIAGNOSIS — J301 Allergic rhinitis due to pollen: Secondary | ICD-10-CM | POA: Diagnosis not present

## 2018-12-08 DIAGNOSIS — J3081 Allergic rhinitis due to animal (cat) (dog) hair and dander: Secondary | ICD-10-CM | POA: Diagnosis not present

## 2018-12-08 DIAGNOSIS — J301 Allergic rhinitis due to pollen: Secondary | ICD-10-CM | POA: Diagnosis not present

## 2018-12-08 DIAGNOSIS — J3089 Other allergic rhinitis: Secondary | ICD-10-CM | POA: Diagnosis not present

## 2019-01-19 DIAGNOSIS — J3081 Allergic rhinitis due to animal (cat) (dog) hair and dander: Secondary | ICD-10-CM | POA: Diagnosis not present

## 2019-01-19 DIAGNOSIS — J3089 Other allergic rhinitis: Secondary | ICD-10-CM | POA: Diagnosis not present

## 2019-01-19 DIAGNOSIS — J301 Allergic rhinitis due to pollen: Secondary | ICD-10-CM | POA: Diagnosis not present

## 2019-02-23 DIAGNOSIS — J301 Allergic rhinitis due to pollen: Secondary | ICD-10-CM | POA: Diagnosis not present

## 2019-02-23 DIAGNOSIS — J3089 Other allergic rhinitis: Secondary | ICD-10-CM | POA: Diagnosis not present

## 2019-02-23 DIAGNOSIS — J3081 Allergic rhinitis due to animal (cat) (dog) hair and dander: Secondary | ICD-10-CM | POA: Diagnosis not present

## 2019-03-26 DIAGNOSIS — M810 Age-related osteoporosis without current pathological fracture: Secondary | ICD-10-CM | POA: Diagnosis not present

## 2019-03-26 DIAGNOSIS — F418 Other specified anxiety disorders: Secondary | ICD-10-CM | POA: Diagnosis not present

## 2019-03-26 DIAGNOSIS — R7301 Impaired fasting glucose: Secondary | ICD-10-CM | POA: Diagnosis not present

## 2019-03-26 DIAGNOSIS — E785 Hyperlipidemia, unspecified: Secondary | ICD-10-CM | POA: Diagnosis not present

## 2019-03-26 DIAGNOSIS — H409 Unspecified glaucoma: Secondary | ICD-10-CM | POA: Diagnosis not present

## 2019-03-26 DIAGNOSIS — Z0000 Encounter for general adult medical examination without abnormal findings: Secondary | ICD-10-CM | POA: Diagnosis not present

## 2019-05-19 DIAGNOSIS — H04123 Dry eye syndrome of bilateral lacrimal glands: Secondary | ICD-10-CM | POA: Diagnosis not present

## 2019-05-19 DIAGNOSIS — H401133 Primary open-angle glaucoma, bilateral, severe stage: Secondary | ICD-10-CM | POA: Diagnosis not present

## 2019-05-19 DIAGNOSIS — H43812 Vitreous degeneration, left eye: Secondary | ICD-10-CM | POA: Diagnosis not present

## 2019-06-05 DIAGNOSIS — J301 Allergic rhinitis due to pollen: Secondary | ICD-10-CM | POA: Diagnosis not present

## 2019-06-05 DIAGNOSIS — J4530 Mild persistent asthma, uncomplicated: Secondary | ICD-10-CM | POA: Diagnosis not present

## 2019-06-05 DIAGNOSIS — J3089 Other allergic rhinitis: Secondary | ICD-10-CM | POA: Diagnosis not present

## 2019-06-05 DIAGNOSIS — J3081 Allergic rhinitis due to animal (cat) (dog) hair and dander: Secondary | ICD-10-CM | POA: Diagnosis not present

## 2019-07-21 DIAGNOSIS — H35352 Cystoid macular degeneration, left eye: Secondary | ICD-10-CM | POA: Diagnosis not present

## 2019-07-21 DIAGNOSIS — H401133 Primary open-angle glaucoma, bilateral, severe stage: Secondary | ICD-10-CM | POA: Diagnosis not present

## 2019-07-21 DIAGNOSIS — H33012 Retinal detachment with single break, left eye: Secondary | ICD-10-CM | POA: Diagnosis not present

## 2019-07-21 DIAGNOSIS — Z961 Presence of intraocular lens: Secondary | ICD-10-CM | POA: Diagnosis not present

## 2019-08-25 DIAGNOSIS — Z961 Presence of intraocular lens: Secondary | ICD-10-CM | POA: Diagnosis not present

## 2019-08-25 DIAGNOSIS — H35352 Cystoid macular degeneration, left eye: Secondary | ICD-10-CM | POA: Diagnosis not present

## 2019-08-25 DIAGNOSIS — H401133 Primary open-angle glaucoma, bilateral, severe stage: Secondary | ICD-10-CM | POA: Diagnosis not present

## 2019-08-25 DIAGNOSIS — H33012 Retinal detachment with single break, left eye: Secondary | ICD-10-CM | POA: Diagnosis not present

## 2019-10-08 DIAGNOSIS — H04123 Dry eye syndrome of bilateral lacrimal glands: Secondary | ICD-10-CM | POA: Diagnosis not present

## 2019-10-08 DIAGNOSIS — H43812 Vitreous degeneration, left eye: Secondary | ICD-10-CM | POA: Diagnosis not present

## 2019-10-08 DIAGNOSIS — H401123 Primary open-angle glaucoma, left eye, severe stage: Secondary | ICD-10-CM | POA: Diagnosis not present

## 2019-11-02 ENCOUNTER — Ambulatory Visit: Payer: Medicare HMO | Attending: Internal Medicine

## 2019-11-06 ENCOUNTER — Ambulatory Visit: Payer: Medicare HMO | Attending: Internal Medicine

## 2019-11-06 DIAGNOSIS — Z23 Encounter for immunization: Secondary | ICD-10-CM | POA: Insufficient documentation

## 2019-11-06 NOTE — Progress Notes (Signed)
   Covid-19 Vaccination Clinic  Name:  Angelica Serrano    MRN: [id number] DOB: [birth year]  11/06/2019  Angelica Serrano was observed post Covid-19 immunization for 15 minutes without incidence. She was provided with Vaccine Information Sheet and instruction to access the V-Safe system.   Angelica Serrano was instructed to call 911 with any severe reactions post vaccine: Marland Kitchen Difficulty breathing  . Swelling of your face and throat  . A fast heartbeat  . A bad rash all over your body  . Dizziness and weakness    Immunizations Administered    Name Date Dose VIS Date Route   Pfizer COVID-19 Vaccine 11/06/2019  4:14 PM 0.3 mL 09/04/2019 Intramuscular   Manufacturer: [place]   Lot: [id number]   [place]: [id number]

## 2019-11-11 DIAGNOSIS — H903 Sensorineural hearing loss, bilateral: Secondary | ICD-10-CM | POA: Diagnosis not present

## 2019-11-11 DIAGNOSIS — H9313 Tinnitus, bilateral: Secondary | ICD-10-CM | POA: Diagnosis not present

## 2019-11-11 DIAGNOSIS — H6122 Impacted cerumen, left ear: Secondary | ICD-10-CM | POA: Diagnosis not present

## 2019-11-21 ENCOUNTER — Ambulatory Visit: Payer: PPO

## 2019-11-24 DIAGNOSIS — H35352 Cystoid macular degeneration, left eye: Secondary | ICD-10-CM | POA: Diagnosis not present

## 2019-11-24 DIAGNOSIS — H401133 Primary open-angle glaucoma, bilateral, severe stage: Secondary | ICD-10-CM | POA: Diagnosis not present

## 2019-11-24 DIAGNOSIS — Z961 Presence of intraocular lens: Secondary | ICD-10-CM | POA: Diagnosis not present

## 2019-11-24 DIAGNOSIS — H33012 Retinal detachment with single break, left eye: Secondary | ICD-10-CM | POA: Diagnosis not present

## 2019-11-29 ENCOUNTER — Ambulatory Visit: Payer: Medicare HMO | Attending: Internal Medicine

## 2019-11-29 DIAGNOSIS — Z23 Encounter for immunization: Secondary | ICD-10-CM | POA: Insufficient documentation

## 2019-11-29 NOTE — Progress Notes (Signed)
   Covid-19 Vaccination Clinic  Name:  Angelica Serrano    MRN: [id number] DOB: 1941-02-26  11/29/2019  Ms. Donson was observed post Covid-19 immunization for 15 minutes without incident. She was provided with Vaccine Information Sheet and instruction to access the V-Safe system.   Ms. Mauger was instructed to call 911 with any severe reactions post vaccine: Marland Kitchen Difficulty breathing  . Swelling of face and throat  . A fast heartbeat  . A bad rash all over body  . Dizziness and weakness   Immunizations Administered    Name Date Dose VIS Date Route   Pfizer COVID-19 Vaccine 11/29/2019  8:31 AM 0.3 mL 09/04/2019 Intramuscular   Manufacturer: [place]   Lot: [id number]   [place]: [id number]

## 2019-12-22 DIAGNOSIS — R69 Illness, unspecified: Secondary | ICD-10-CM | POA: Diagnosis not present

## 2020-04-04 DIAGNOSIS — Z5181 Encounter for therapeutic drug level monitoring: Secondary | ICD-10-CM | POA: Diagnosis not present

## 2020-04-04 DIAGNOSIS — R7303 Prediabetes: Secondary | ICD-10-CM | POA: Diagnosis not present

## 2020-04-04 DIAGNOSIS — R5382 Chronic fatigue, unspecified: Secondary | ICD-10-CM | POA: Diagnosis not present

## 2020-04-04 DIAGNOSIS — R399 Unspecified symptoms and signs involving the genitourinary system: Secondary | ICD-10-CM | POA: Diagnosis not present

## 2020-04-04 DIAGNOSIS — E538 Deficiency of other specified B group vitamins: Secondary | ICD-10-CM | POA: Diagnosis not present

## 2020-04-04 DIAGNOSIS — Z0000 Encounter for general adult medical examination without abnormal findings: Secondary | ICD-10-CM | POA: Diagnosis not present

## 2020-04-04 DIAGNOSIS — J4530 Mild persistent asthma, uncomplicated: Secondary | ICD-10-CM | POA: Diagnosis not present

## 2020-04-04 DIAGNOSIS — M810 Age-related osteoporosis without current pathological fracture: Secondary | ICD-10-CM | POA: Diagnosis not present

## 2020-04-04 DIAGNOSIS — E785 Hyperlipidemia, unspecified: Secondary | ICD-10-CM | POA: Diagnosis not present

## 2020-04-04 DIAGNOSIS — R110 Nausea: Secondary | ICD-10-CM | POA: Diagnosis not present

## 2020-04-12 DIAGNOSIS — E538 Deficiency of other specified B group vitamins: Secondary | ICD-10-CM | POA: Diagnosis not present

## 2020-04-19 DIAGNOSIS — E538 Deficiency of other specified B group vitamins: Secondary | ICD-10-CM | POA: Diagnosis not present

## 2020-04-26 DIAGNOSIS — E538 Deficiency of other specified B group vitamins: Secondary | ICD-10-CM | POA: Diagnosis not present

## 2020-05-03 DIAGNOSIS — E538 Deficiency of other specified B group vitamins: Secondary | ICD-10-CM | POA: Diagnosis not present

## 2020-05-12 DIAGNOSIS — R69 Illness, unspecified: Secondary | ICD-10-CM | POA: Diagnosis not present

## 2020-05-31 DIAGNOSIS — H35352 Cystoid macular degeneration, left eye: Secondary | ICD-10-CM | POA: Diagnosis not present

## 2020-05-31 DIAGNOSIS — Z961 Presence of intraocular lens: Secondary | ICD-10-CM | POA: Diagnosis not present

## 2020-05-31 DIAGNOSIS — H401133 Primary open-angle glaucoma, bilateral, severe stage: Secondary | ICD-10-CM | POA: Diagnosis not present

## 2020-05-31 DIAGNOSIS — H33012 Retinal detachment with single break, left eye: Secondary | ICD-10-CM | POA: Diagnosis not present

## 2020-06-09 DIAGNOSIS — Z23 Encounter for immunization: Secondary | ICD-10-CM | POA: Diagnosis not present

## 2020-07-04 ENCOUNTER — Encounter: Payer: Self-pay | Admitting: Psychology

## 2020-07-04 ENCOUNTER — Other Ambulatory Visit: Payer: Self-pay

## 2020-07-04 ENCOUNTER — Ambulatory Visit: Payer: Medicare HMO | Admitting: Psychology

## 2020-07-04 ENCOUNTER — Ambulatory Visit (INDEPENDENT_AMBULATORY_CARE_PROVIDER_SITE_OTHER): Payer: Medicare HMO | Admitting: Psychology

## 2020-07-04 DIAGNOSIS — G3184 Mild cognitive impairment, so stated: Secondary | ICD-10-CM | POA: Diagnosis not present

## 2020-07-04 DIAGNOSIS — H409 Unspecified glaucoma: Secondary | ICD-10-CM | POA: Insufficient documentation

## 2020-07-04 DIAGNOSIS — F411 Generalized anxiety disorder: Secondary | ICD-10-CM | POA: Insufficient documentation

## 2020-07-04 DIAGNOSIS — N814 Uterovaginal prolapse, unspecified: Secondary | ICD-10-CM | POA: Insufficient documentation

## 2020-07-04 DIAGNOSIS — Z9849 Cataract extraction status, unspecified eye: Secondary | ICD-10-CM | POA: Insufficient documentation

## 2020-07-04 DIAGNOSIS — R4189 Other symptoms and signs involving cognitive functions and awareness: Secondary | ICD-10-CM

## 2020-07-04 DIAGNOSIS — R7301 Impaired fasting glucose: Secondary | ICD-10-CM | POA: Insufficient documentation

## 2020-07-04 DIAGNOSIS — M810 Age-related osteoporosis without current pathological fracture: Secondary | ICD-10-CM | POA: Insufficient documentation

## 2020-07-04 DIAGNOSIS — E785 Hyperlipidemia, unspecified: Secondary | ICD-10-CM | POA: Insufficient documentation

## 2020-07-04 DIAGNOSIS — R7303 Prediabetes: Secondary | ICD-10-CM | POA: Insufficient documentation

## 2020-07-04 DIAGNOSIS — J4530 Mild persistent asthma, uncomplicated: Secondary | ICD-10-CM | POA: Insufficient documentation

## 2020-07-04 DIAGNOSIS — R5382 Chronic fatigue, unspecified: Secondary | ICD-10-CM | POA: Insufficient documentation

## 2020-07-04 HISTORY — DX: Mild cognitive impairment of uncertain or unknown etiology: G31.84

## 2020-07-04 NOTE — Progress Notes (Signed)
   Psychometrician Note   Cognitive testing was administered to Angelica Serrano by Angelica Serrano, B.S. (psychometrist) under the supervision of Dr. Christia Reading, Ph.D., licensed psychologist on 07/04/20. Angelica Serrano did not appear overtly distressed by the testing session per behavioral observation or responses across self-report questionnaires. Dr. Christia Reading, Ph.D. checked in with Angelica Serrano as needed to manage any distress related to testing procedures (if applicable). Rest breaks were offered.    The battery of tests administered was selected by Dr. Christia Reading, Ph.D. with consideration to Angelica Serrano's current level of functioning, the nature of her symptoms, emotional and behavioral responses during interview, level of literacy, observed level of motivation/effort, and the nature of the referral question. This battery was communicated to the psychometrist. Communication between Dr. Christia Reading, Ph.D. and the psychometrist was ongoing throughout the evaluation and Dr. Christia Reading, Ph.D. was immediately accessible at all times. Dr. Christia Reading, Ph.D. provided supervision to the psychometrist on the date of this service to the extent necessary to assure the quality of all services provided.    Angelica Serrano will return within approximately 1-2 weeks for an interactive feedback session with Dr. Melvyn Novas at which time her test performances, clinical impressions, and treatment recommendations will be reviewed in detail. Angelica Serrano understands she can contact our office should she require our assistance before this time.  A total of 150 minutes of billable time were spent face-to-face with Angelica Serrano by the psychometrist. This includes both test administration and scoring time. Billing for these services is reflected in the clinical report generated by Dr. Christia Reading, Ph.D..  This note reflects time spent with the psychometrician and does not include test scores or any clinical interpretations  made by Dr. Melvyn Novas. The full report will follow in a separate note.
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
## 2020-07-11 ENCOUNTER — Ambulatory Visit (INDEPENDENT_AMBULATORY_CARE_PROVIDER_SITE_OTHER): Payer: Medicare HMO | Admitting: Psychology

## 2020-07-11 ENCOUNTER — Other Ambulatory Visit: Payer: Self-pay

## 2020-07-11 DIAGNOSIS — G3184 Mild cognitive impairment, so stated: Secondary | ICD-10-CM

## 2020-07-11 NOTE — Patient Instructions (Signed)
A repeat neuropsychological evaluation in 12-18 months (or sooner if functional decline is noted) is recommended to assess the trajectory of future cognitive decline should it occur. This will also aid in future efforts towards improved diagnostic clarity.  No neuroimaging was available for my review at the present time. As such, I recommend that Angelica Serrano be referred for a brain MRI to assess for any anatomical explanations for observed cognitive weaknesses. This will also allow for tracking any anatomical changes over time should they occur.   I also was unable to determine if Angelica Serrano was being followed by a neurologist given records available for my review. As such, I will place a referral for her. If she is already being followed by a neurologist that is beyond my knowledge, then she should disregard this referral. When she meets with her neurologist, she would be encouraged to discuss medication options to help with memory dysfunction. It is important to highlight that while some medications (e.g., Aricept/donepezil) may slow functional decline in individuals, no current treatments are able to stop or reverse cognitive decline in the face of a neurodegenerative illness.   She should also discuss medication options beyond CBD gummies with her PCP to aid with ongoing sleep dysfunction and mild anxiety/stressors in her day-to-day life.   Should there be a progression of her current deficits over time, Angelica Serrano is unlikely to regain any independent living skills lost. Therefore, it is recommended that she remain as involved as possible in all aspects of household chores, finances, and medication management, with supervision to ensure adequate performance. She will likely benefit from the establishment and maintenance of a routine in order to maximize her functional abilities over time.  It will be important for Angelica Serrano to have another person with her when in situations where she may need to process  information, weigh the pros and cons of different options, and make decisions, in order to ensure that she fully understands and recalls all information to be considered.  If not already done, Angelica Serrano and her family may want to discuss her wishes regarding durable power of attorney and medical decision making, so that she can have input into these choices. Additionally, they may wish to discuss future plans for caretaking and seek out community options for in home/residential care should they become necessary.  Angelica Serrano is encouraged to attend to lifestyle factors for brain health (e.g., regular physical exercise, good nutrition habits, regular participation in cognitively-stimulating activities, and general stress management techniques), which are likely to have benefits for both emotional adjustment and cognition. Optimal control of vascular risk factors (including safe cardiovascular exercise and adherence to dietary recommendations) is encouraged.   Information important to remember should be placed in written format in all instances. This should be placed in a highly visible and commonly frequented area of her home to promote recall.   To address problems with fluctuating attention, she may wish to consider:   -Avoiding external distractions when needing to concentrate   -Limiting exposure to fast paced environments with multiple sensory demands   -Writing down complicated information and using checklists   -Attempting and completing one task at a time (i.e., no multi-tasking)   -Verbalizing aloud each step of a task to maintain focus   -Reducing the amount of information considered at one time

## 2020-07-11 NOTE — Progress Notes (Signed)
° °  Neuropsychology Feedback Session [name]. [facility]  Reason for Referral:   Angelica Serrano a 79 y.o. left-handed Caucasian female referred by Maurice Small, M.D.,to characterize hercurrent cognitive functioning and assist with diagnostic clarity and treatment planning in the context of subjective cognitive decline, potentially exacerbated by mild anxiety.   Feedback:   [name] completed a comprehensive neuropsychological evaluation on 07/04/2020. Please refer to that encounter for the full report and recommendations. Briefly, results suggested prominent impairments across verbal memory, along with additional deficits in working memory and semantic fluency. Performance variability was also exhibited across executive functioning. Across verbal memory measures, [name] was fully amnestic, did not benefit from cueing, and appeared to exhibit behaviors consistent with rapid forgetting. She also exhibited a noted discrepancy between phonemic and semantic fluency (with the latter suggesting impairment), as well as deficits across several aspects of executive functioning. This pattern of performance across testing is concerning for Alzheimer's disease. With that being said, her pattern of performance is not wholly consistent with this condition. Namely, she performed well across a confrontation naming task and was able to show some retention of visual information across a forced choice shape learning task. However, despite these discrepancies, there remains the possibility that impairments suggest earlier stages of Alzheimer's disease.  [name] was accompanied by her daughter Elmyra Ricks and daughter-in-law Freda Munro during the current telephone call. [name] and her daughter were within her residence while I was within my office. I discussed the limitations of evaluation and management by telemedicine and the availability of in person appointments. [name] expressed  her understanding and agreed to proceed. Content of the current session focused on the results of her neuropsychological evaluation. [name] and her family were given the opportunity to ask questions and their questions were answered. They were encouraged to reach out should additional questions arise. A copy of her report was mailed at the conclusion of the previous visit.      23 minutes were spent conducting the current feedback session with [name], billed as one unit [phone].

## 2020-07-19 DIAGNOSIS — G3184 Mild cognitive impairment, so stated: Secondary | ICD-10-CM | POA: Diagnosis not present

## 2020-07-19 DIAGNOSIS — R69 Illness, unspecified: Secondary | ICD-10-CM | POA: Diagnosis not present

## 2020-07-20 ENCOUNTER — Other Ambulatory Visit: Payer: Self-pay | Admitting: Family Medicine

## 2020-07-20 DIAGNOSIS — R4182 Altered mental status, unspecified: Secondary | ICD-10-CM

## 2020-07-21 DIAGNOSIS — R69 Illness, unspecified: Secondary | ICD-10-CM | POA: Diagnosis not present

## 2020-08-03 ENCOUNTER — Other Ambulatory Visit: Payer: Self-pay

## 2020-08-03 ENCOUNTER — Encounter: Payer: Self-pay | Admitting: Neurology

## 2020-08-03 ENCOUNTER — Ambulatory Visit: Payer: Medicare HMO | Admitting: Neurology

## 2020-08-03 VITALS — BP 183/89 | HR 52 | Resp 18 | Ht 64.0 in | Wt 141.0 lb

## 2020-08-03 DIAGNOSIS — G3184 Mild cognitive impairment, so stated: Secondary | ICD-10-CM | POA: Diagnosis not present

## 2020-08-03 MED ORDER — DONEPEZIL HCL 5 MG PO TABS: 5.0000 mg | ORAL_TABLET | Freq: Every day | ORAL | 3 refills | Status: DC

## 2020-08-03 NOTE — Progress Notes (Signed)
NEUROLOGY CONSULTATION NOTE  [name] MRN: [id number] DOB: [birth year]  Referring provider: Dr. Hazle Coca Primary care provider: Dr. Maurice Small   Reason for consult:  Mild Neurocognitive Disorder  Dear Dr Melvyn Novas:  Thank you for your kind referral of Angelica Serrano for consultation of the above symptoms. Although her history is well known to you, please allow me to reiterate it for the purpose of our medical record. The patient was accompanied to the clinic by her daughter-in-law Freda Munro who also provides collateral information. Records and images were personally reviewed where available.   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 Her daughter-in-law Freda Munro is present to provide additional information. She feels her memory is a little bad "but not where it bothers me." She cannot answer a question quickly when put on the spot, but eventually can answer it. She lives alone. She has reduced driving due to her vision, denies getting lost driving. She denies missing medications. She has a chart for daily eye drop intake. She denies missing bill payments. She has not left the stove on. She denies misplacing things frequently. Family started noticing changes a couple of years ago where she would repeat the same stories. She was more forgetful, for instance asking if she drove or they drove to a baseball game they attended. Over the past year with the pandemic, she was not multitasking as much. No hygiene concerns, she is independent with dressing and bathing. There was also more anxiety and insomnia. The anxiety has improved. She has difficulty with sleep maintenance, she wakes up and thinks of so many things. She usually gets 8 hours of sleep and does not take naps. Mood is "pretty darn good." No personality changes, paranoia, or hallucinations. No family  history of dementia. She has had several head injuries in her teens where she lost consciousness. She rarely drinks alcohol. She works on puzzles regularly and likes to draw and sketch.   She underwent Neuropsychological testing in October 2021, results reviewed. There was note of prominent impairments across verbal memory, along with additional deficits in working memory and semantic fluency. She met criteria for Mild Neurocognitive Disorder, but likely towards the moderate to moderate-severe end of spectrum. Etiology was concerning for Alzheimer's disease, however pattern was not wholly consistent with AD (performed well across confrontation naming task and had retention of visual information).     PAST MEDICAL HISTORY: Past Medical History:  Diagnosis Date  . BCC (basal cell carcinoma), face 2016   Nose; removed in 2016  . Chronic fatigue   . Elevated fasting blood sugar   . First degree AV block 2016  . Generalized anxiety disorder   . Glaucoma   . History of cataract surgery   . Hyperlipidemia   . Mild neurocognitive disorder 07/04/2020  . Mild persistent asthma   . Osteoporosis   . Prediabetes   . Retinal detachment of left eye with single break    Resolved without the need for treatment  . Uterine prolapse     PAST SURGICAL HISTORY: Past Surgical History:  Procedure Laterality Date  . ANTERIOR AND POSTERIOR REPAIR WITH SACROSPINOUS FIXATION N/A 07/21/2015   Procedure: ANTERIOR Repair  AND Gaspar Garbe cul de sac culdoplasty;  Surgeon: Dian Queen, MD;  Location: [facility];  Service: Gynecology;  Laterality: N/A;  . APPENDECTOMY    . HAND SURGERY    .  TONSILLECTOMY    . VAGINAL HYSTERECTOMY N/A 07/21/2015   Procedure: HYSTERECTOMY VAGINAL;  Surgeon: Dian Queen, MD;  Location: [facility];  Service: Gynecology;  Laterality: N/A;    MEDICATIONS: Current Outpatient Medications on File Prior to Visit  Medication Sig Dispense Refill  . bimatoprost (LUMIGAN) 0.03 % ophthalmic  solution Place 1 drop into the right eye at bedtime.    . Brimonidine Tartrate-Timolol (COMBIGAN OP) Place 1 drop into the right eye 2 (two) times daily.    Marland Kitchen CALCIUM CITRATE PO Take 2 tablets by mouth 2 (two) times daily.    . dorzolamide (TRUSOPT) 2 % ophthalmic solution Place 1 drop into the right eye 2 (two) times daily.     . Omega-3 Fatty Acids (FISH OIL PO) Take 1 capsule by mouth 3 (three) times daily.     No current facility-administered medications on file prior to visit.    ALLERGIES: No Known Allergies  FAMILY HISTORY: Family History  Problem Relation Age of Onset  . Breast cancer Mother   . Dementia Neg Hx     SOCIAL HISTORY: Social History   Socioeconomic History  . Marital status: Divorced    Spouse name: Not on file  . Number of children: 4  . Years of education: 4  . Highest education level: Some college, no degree  Occupational History  . Occupation: Retired  Tobacco Use  . Smoking status: Former Research scientist (life sciences)  . Smokeless tobacco: Never Used  Substance and Sexual Activity  . Alcohol use: No  . Drug use: No  . Sexual activity: Not Currently  Other Topics Concern  . Not on file  Social History Narrative   Left handed   One story home   Drinks caffeine occasionally   Social Determinants of Health   Financial Resource Strain:   . Difficulty of Paying Living Expenses: Not on file  Food Insecurity:   . Worried About Charity fundraiser in the Last Year: Not on file  . Ran Out of Food in the Last Year: Not on file  Transportation Needs:   . Lack of Transportation (Medical): Not on file  . Lack of Transportation (Non-Medical): Not on file  Physical Activity:   . Days of Exercise per Week: Not on file  . Minutes of Exercise per Session: Not on file  Stress:   . Feeling of Stress : Not on file  Social Connections:   . Frequency of Communication with Friends and Family: Not on file  . Frequency of Social Gatherings with Friends and Family: Not on file    . Attends Religious Services: Not on file  . Active Member of Clubs or Organizations: Not on file  . Attends Archivist Meetings: Not on file  . Marital Status: Not on file  Intimate Partner Violence:   . Fear of Current or Ex-Partner: Not on file  . Emotionally Abused: Not on file  . Physically Abused: Not on file  . Sexually Abused: Not on file     PHYSICAL EXAM: Vitals:   08/03/20 1258  BP: (!) 183/89  Pulse: (!) 52  Resp: 18  SpO2: 95%   General: No acute distress Head:  Normocephalic/atraumatic Skin/Extremities: No rash, no edema Neurological Exam: Mental status: alert and awake, no dysarthria or aphasia, Fund of knowledge is appropriate.  Recent and remote memory are intact.  Attention and concentration are normal.   Cranial nerves: CN I: not tested CN II: pupils equal, round and reactive to light, visual fields  intact CN III, IV, VI:  full range of motion, no nystagmus, no ptosis CN V: facial sensation intact CN VII: upper and lower face symmetric CN VIII: hearing intact to conversation CN IX, X: gag intact, uvula midline CN XI: sternocleidomastoid and trapezius muscles intact CN XII: tongue midline Bulk & Tone: normal, no fasciculations. Motor: 5/5 throughout with no pronator drift. Sensation: intact to light touch, cold, pin, vibration sense.  No extinction to double simultaneous stimulation.  Romberg test negative Deep Tendon Reflexes: +1 throughout Cerebellar: no incoordination on finger to nose testing Gait: narrow-based and steady, mild difficulty with tandem walk Tremor: none   IMPRESSION: This is a pleasant 79 year old left-handed woman with a history of hyperlipidemia, osteoporosis, glaucoma, with recent Neuropsychological testing indicating Mild Neurocognitive Disorder, possibly due to Alzheimer's disease. Findings discussed with patient and family today. Proceed with MRI brain as scheduled this month. We discussed medications such as  Donepezil, including side effects and expectations. They are agreeable to starting Donepezil 19m: take 1/2 tablet daily for 2 weeks, then increase to 1 tablet daily. She will be scheduled for repeat Neuropsychological evaluation in a year to assess trajectory. We discussed the importance of control of vascular risk factors, physical exercise, and brain stimulation exercises for brain health. Continue to monitor driving and home safety. Follow-up in 6-8 months, they know to call for any changes..   Thank you for allowing me to participate in the care of this patient. Please do not hesitate to call for any questions or concerns.   KEllouise Newer M.D.  CC: Dr. MMelvyn Novas Dr. WJustin Mend

## 2020-08-03 NOTE — Patient Instructions (Signed)
1. Start Donepezil 5mg : Take 1/2 tablet daily for 2 weeks, then increase to 1 tablet daily  2. Proceed with MRI brain as scheduled  3. Schedule repeat Neurocognitive testing for October 2022  4. Follow-up in 6-8 months, call for any changes   FALL PRECAUTIONS: Be cautious when walking. Scan the area for obstacles that may increase the risk of trips and falls. When getting up in the mornings, sit up at the edge of the bed for a few minutes before getting out of bed. Consider elevating the bed at the head end to avoid drop of blood pressure when getting up. Walk always in a well-lit room (use night lights in the walls). Avoid area rugs or power cords from appliances in the middle of the walkways. Use a walker or a cane if necessary and consider physical therapy for balance exercise. Get your eyesight checked regularly.  FINANCIAL OVERSIGHT: Supervision, especially oversight when making financial decisions or transactions is also recommended as difficulties arise.  HOME SAFETY: Consider the safety of the kitchen when operating appliances like stoves, microwave oven, and blender. Consider having supervision and share cooking responsibilities until no longer able to participate in those. Accidents with firearms and other hazards in the house should be identified and addressed as well.  DRIVING: Regarding driving, in patients with progressive memory problems, driving will be impaired. We advise to have someone else do the driving if trouble finding directions or if minor accidents are reported. Independent driving assessment is available to determine safety of driving.  ABILITY TO BE LEFT ALONE: If patient is unable to contact 911 operator, consider using LifeLine, or when the need is there, arrange for someone to stay with patients. Smoking is a fire hazard, consider supervision or cessation. Risk of wandering should be assessed by caregiver and if detected at any point, supervision and safe proof  recommendations should be instituted.  MEDICATION SUPERVISION: Inability to self-administer medication needs to be constantly addressed. Implement a mechanism to ensure safe administration of the medications.  RECOMMENDATIONS FOR ALL PATIENTS WITH MEMORY PROBLEMS: 1. Continue to exercise (Recommend 30 minutes of walking everyday, or 3 hours every week) 2. Increase social interactions - continue going to [place] and enjoy social gatherings with friends and family 3. Eat healthy, avoid fried foods and eat more fruits and vegetables 4. Maintain adequate blood pressure, blood sugar, and blood cholesterol level. Reducing the risk of stroke and cardiovascular disease also helps promoting better memory. 5. Avoid stressful situations. Live a simple life and avoid aggravations. Organize your time and prepare for the next day in anticipation. 6. Sleep well, avoid any interruptions of sleep and avoid any distractions in the bedroom that may interfere with adequate sleep quality 7. Avoid sugar, avoid sweets as there is a strong link between excessive sugar intake, diabetes, and cognitive impairment We discussed the Mediterranean diet, which has been shown to help patients reduce the risk of progressive memory disorders and reduces cardiovascular risk. This includes eating fish, eat fruits and green leafy vegetables, nuts like almonds and hazelnuts, walnuts, and also use olive oil. Avoid fast foods and fried foods as much as possible. Avoid sweets and sugar as sugar use has been linked to worsening of memory function.  There is always a concern of gradual progression of memory problems. If this is the case, then we may need to adjust level of care according to patient needs. Support, both to the patient and caregiver, should then be put into place.

## 2020-08-09 DIAGNOSIS — G3184 Mild cognitive impairment, so stated: Secondary | ICD-10-CM | POA: Diagnosis not present

## 2020-08-16 ENCOUNTER — Ambulatory Visit
Admission: RE | Admit: 2020-08-16 | Discharge: 2020-08-16 | Disposition: A | Discharge status: Home / Self Care | Payer: Medicare HMO | Source: Ambulatory Visit | Attending: Family Medicine | Admitting: Family Medicine

## 2020-08-16 ENCOUNTER — Other Ambulatory Visit: Payer: Self-pay

## 2020-08-16 DIAGNOSIS — I6782 Cerebral ischemia: Secondary | ICD-10-CM | POA: Diagnosis not present

## 2020-08-16 DIAGNOSIS — G3184 Mild cognitive impairment, so stated: Secondary | ICD-10-CM | POA: Diagnosis not present

## 2020-08-16 DIAGNOSIS — R4182 Altered mental status, unspecified: Secondary | ICD-10-CM

## 2020-09-06 DIAGNOSIS — Z961 Presence of intraocular lens: Secondary | ICD-10-CM | POA: Diagnosis not present

## 2020-09-06 DIAGNOSIS — H401133 Primary open-angle glaucoma, bilateral, severe stage: Secondary | ICD-10-CM | POA: Diagnosis not present

## 2020-09-06 DIAGNOSIS — H35352 Cystoid macular degeneration, left eye: Secondary | ICD-10-CM | POA: Diagnosis not present

## 2020-09-06 DIAGNOSIS — H33012 Retinal detachment with single break, left eye: Secondary | ICD-10-CM | POA: Diagnosis not present

## 2020-09-07 DIAGNOSIS — H401133 Primary open-angle glaucoma, bilateral, severe stage: Secondary | ICD-10-CM | POA: Diagnosis not present

## 2020-09-07 DIAGNOSIS — H59032 Cystoid macular edema following cataract surgery, left eye: Secondary | ICD-10-CM | POA: Diagnosis not present

## 2020-09-07 DIAGNOSIS — H43812 Vitreous degeneration, left eye: Secondary | ICD-10-CM | POA: Diagnosis not present

## 2020-09-07 DIAGNOSIS — H04123 Dry eye syndrome of bilateral lacrimal glands: Secondary | ICD-10-CM | POA: Diagnosis not present

## 2020-09-08 DIAGNOSIS — G3184 Mild cognitive impairment, so stated: Secondary | ICD-10-CM | POA: Diagnosis not present

## 2020-09-08 DIAGNOSIS — R69 Illness, unspecified: Secondary | ICD-10-CM | POA: Diagnosis not present

## 2020-10-13 ENCOUNTER — Ambulatory Visit: Payer: Medicare HMO | Admitting: Neurology

## 2020-10-25 ENCOUNTER — Ambulatory Visit: Payer: Medicare HMO | Admitting: Physician Assistant

## 2021-01-04 ENCOUNTER — Ambulatory Visit: Payer: Medicare HMO | Admitting: Physician Assistant

## 2021-03-07 DIAGNOSIS — H33012 Retinal detachment with single break, left eye: Secondary | ICD-10-CM | POA: Diagnosis not present

## 2021-03-07 DIAGNOSIS — H35352 Cystoid macular degeneration, left eye: Secondary | ICD-10-CM | POA: Diagnosis not present

## 2021-03-07 DIAGNOSIS — Z961 Presence of intraocular lens: Secondary | ICD-10-CM | POA: Diagnosis not present

## 2021-03-07 DIAGNOSIS — H401133 Primary open-angle glaucoma, bilateral, severe stage: Secondary | ICD-10-CM | POA: Diagnosis not present

## 2021-03-28 ENCOUNTER — Ambulatory Visit: Payer: Medicare HMO | Admitting: Physician Assistant

## 2021-04-07 DIAGNOSIS — Z0000 Encounter for general adult medical examination without abnormal findings: Secondary | ICD-10-CM | POA: Diagnosis not present

## 2021-04-07 DIAGNOSIS — R7303 Prediabetes: Secondary | ICD-10-CM | POA: Diagnosis not present

## 2021-04-07 DIAGNOSIS — Z5181 Encounter for therapeutic drug level monitoring: Secondary | ICD-10-CM | POA: Diagnosis not present

## 2021-04-07 DIAGNOSIS — M810 Age-related osteoporosis without current pathological fracture: Secondary | ICD-10-CM | POA: Diagnosis not present

## 2021-04-07 DIAGNOSIS — E785 Hyperlipidemia, unspecified: Secondary | ICD-10-CM | POA: Diagnosis not present

## 2021-04-07 DIAGNOSIS — H409 Unspecified glaucoma: Secondary | ICD-10-CM | POA: Diagnosis not present

## 2021-04-07 DIAGNOSIS — J4530 Mild persistent asthma, uncomplicated: Secondary | ICD-10-CM | POA: Diagnosis not present

## 2021-04-24 ENCOUNTER — Ambulatory Visit: Payer: Medicare HMO | Admitting: Neurology

## 2021-06-13 DIAGNOSIS — Z23 Encounter for immunization: Secondary | ICD-10-CM | POA: Diagnosis not present

## 2021-07-11 ENCOUNTER — Encounter: Payer: Medicare HMO | Admitting: Psychology

## 2021-07-18 ENCOUNTER — Encounter: Payer: Medicare HMO | Admitting: Psychology

## 2021-10-03 DIAGNOSIS — Z0100 Encounter for examination of eyes and vision without abnormal findings: Secondary | ICD-10-CM | POA: Diagnosis not present

## 2021-11-21 DIAGNOSIS — D235 Other benign neoplasm of skin of trunk: Secondary | ICD-10-CM | POA: Diagnosis not present

## 2021-11-21 DIAGNOSIS — Z85828 Personal history of other malignant neoplasm of skin: Secondary | ICD-10-CM | POA: Diagnosis not present

## 2021-11-21 DIAGNOSIS — Z08 Encounter for follow-up examination after completed treatment for malignant neoplasm: Secondary | ICD-10-CM | POA: Diagnosis not present

## 2021-11-21 DIAGNOSIS — D225 Melanocytic nevi of trunk: Secondary | ICD-10-CM | POA: Diagnosis not present

## 2021-11-21 DIAGNOSIS — L821 Other seborrheic keratosis: Secondary | ICD-10-CM | POA: Diagnosis not present

## 2021-11-21 DIAGNOSIS — L570 Actinic keratosis: Secondary | ICD-10-CM | POA: Diagnosis not present

## 2021-11-21 DIAGNOSIS — C4401 Basal cell carcinoma of skin of lip: Secondary | ICD-10-CM | POA: Diagnosis not present

## 2021-11-21 DIAGNOSIS — L814 Other melanin hyperpigmentation: Secondary | ICD-10-CM | POA: Diagnosis not present

## 2021-11-21 DIAGNOSIS — D485 Neoplasm of uncertain behavior of skin: Secondary | ICD-10-CM | POA: Diagnosis not present

## 2021-11-21 DIAGNOSIS — D3701 Neoplasm of uncertain behavior of lip: Secondary | ICD-10-CM | POA: Diagnosis not present

## 2022-02-02 DIAGNOSIS — C4491 Basal cell carcinoma of skin, unspecified: Secondary | ICD-10-CM | POA: Diagnosis not present

## 2022-02-02 DIAGNOSIS — Z481 Encounter for planned postprocedural wound closure: Secondary | ICD-10-CM | POA: Diagnosis not present

## 2022-02-02 DIAGNOSIS — C4401 Basal cell carcinoma of skin of lip: Secondary | ICD-10-CM | POA: Diagnosis not present

## 2022-05-07 DIAGNOSIS — E785 Hyperlipidemia, unspecified: Secondary | ICD-10-CM | POA: Diagnosis not present

## 2022-05-07 DIAGNOSIS — R7303 Prediabetes: Secondary | ICD-10-CM | POA: Diagnosis not present

## 2022-05-09 DIAGNOSIS — Z0000 Encounter for general adult medical examination without abnormal findings: Secondary | ICD-10-CM | POA: Diagnosis not present

## 2022-05-09 DIAGNOSIS — G3184 Mild cognitive impairment, so stated: Secondary | ICD-10-CM | POA: Diagnosis not present

## 2022-05-09 DIAGNOSIS — M810 Age-related osteoporosis without current pathological fracture: Secondary | ICD-10-CM | POA: Diagnosis not present

## 2022-05-09 DIAGNOSIS — E785 Hyperlipidemia, unspecified: Secondary | ICD-10-CM | POA: Diagnosis not present

## 2022-05-09 DIAGNOSIS — Z1211 Encounter for screening for malignant neoplasm of colon: Secondary | ICD-10-CM | POA: Diagnosis not present

## 2022-05-09 DIAGNOSIS — I10 Essential (primary) hypertension: Secondary | ICD-10-CM | POA: Diagnosis not present

## 2022-05-09 DIAGNOSIS — Z23 Encounter for immunization: Secondary | ICD-10-CM | POA: Diagnosis not present

## 2022-05-09 DIAGNOSIS — R7303 Prediabetes: Secondary | ICD-10-CM | POA: Diagnosis not present

## 2022-08-09 DIAGNOSIS — E785 Hyperlipidemia, unspecified: Secondary | ICD-10-CM | POA: Diagnosis not present

## 2022-08-09 DIAGNOSIS — G3184 Mild cognitive impairment, so stated: Secondary | ICD-10-CM | POA: Diagnosis not present

## 2022-08-09 DIAGNOSIS — I10 Essential (primary) hypertension: Secondary | ICD-10-CM | POA: Diagnosis not present

## 2022-08-09 DIAGNOSIS — Z23 Encounter for immunization: Secondary | ICD-10-CM | POA: Diagnosis not present

## 2022-08-09 DIAGNOSIS — Z1211 Encounter for screening for malignant neoplasm of colon: Secondary | ICD-10-CM | POA: Diagnosis not present

## 2022-09-26 DIAGNOSIS — J40 Bronchitis, not specified as acute or chronic: Secondary | ICD-10-CM | POA: Diagnosis not present

## 2022-09-26 DIAGNOSIS — R410 Disorientation, unspecified: Secondary | ICD-10-CM | POA: Diagnosis not present

## 2023-02-07 DIAGNOSIS — D225 Melanocytic nevi of trunk: Secondary | ICD-10-CM | POA: Diagnosis not present

## 2023-02-07 DIAGNOSIS — Z08 Encounter for follow-up examination after completed treatment for malignant neoplasm: Secondary | ICD-10-CM | POA: Diagnosis not present

## 2023-02-07 DIAGNOSIS — Z85828 Personal history of other malignant neoplasm of skin: Secondary | ICD-10-CM | POA: Diagnosis not present

## 2023-02-07 DIAGNOSIS — L821 Other seborrheic keratosis: Secondary | ICD-10-CM | POA: Diagnosis not present

## 2023-02-07 DIAGNOSIS — L814 Other melanin hyperpigmentation: Secondary | ICD-10-CM | POA: Diagnosis not present

## 2023-04-03 DIAGNOSIS — H401123 Primary open-angle glaucoma, left eye, severe stage: Secondary | ICD-10-CM | POA: Diagnosis not present

## 2023-04-03 DIAGNOSIS — H59032 Cystoid macular edema following cataract surgery, left eye: Secondary | ICD-10-CM | POA: Diagnosis not present

## 2023-04-03 DIAGNOSIS — H4032X3 Glaucoma secondary to eye trauma, left eye, severe stage: Secondary | ICD-10-CM | POA: Diagnosis not present

## 2023-04-06 DIAGNOSIS — J45909 Unspecified asthma, uncomplicated: Secondary | ICD-10-CM | POA: Diagnosis not present

## 2023-04-06 DIAGNOSIS — Z008 Encounter for other general examination: Secondary | ICD-10-CM | POA: Diagnosis not present

## 2023-04-06 DIAGNOSIS — F411 Generalized anxiety disorder: Secondary | ICD-10-CM | POA: Diagnosis not present

## 2023-04-06 DIAGNOSIS — M810 Age-related osteoporosis without current pathological fracture: Secondary | ICD-10-CM | POA: Diagnosis not present

## 2023-04-06 DIAGNOSIS — G309 Alzheimer's disease, unspecified: Secondary | ICD-10-CM | POA: Diagnosis not present

## 2023-04-06 DIAGNOSIS — Q150 Congenital glaucoma: Secondary | ICD-10-CM | POA: Diagnosis not present

## 2023-04-06 DIAGNOSIS — Z809 Family history of malignant neoplasm, unspecified: Secondary | ICD-10-CM | POA: Diagnosis not present

## 2023-04-06 DIAGNOSIS — Z8249 Family history of ischemic heart disease and other diseases of the circulatory system: Secondary | ICD-10-CM | POA: Diagnosis not present

## 2023-04-06 DIAGNOSIS — E785 Hyperlipidemia, unspecified: Secondary | ICD-10-CM | POA: Diagnosis not present

## 2023-04-06 DIAGNOSIS — I10 Essential (primary) hypertension: Secondary | ICD-10-CM | POA: Diagnosis not present

## 2023-04-06 DIAGNOSIS — F0280 Dementia in other diseases classified elsewhere without behavioral disturbance: Secondary | ICD-10-CM | POA: Diagnosis not present

## 2023-04-11 DIAGNOSIS — R413 Other amnesia: Secondary | ICD-10-CM | POA: Diagnosis not present

## 2023-04-11 DIAGNOSIS — E559 Vitamin D deficiency, unspecified: Secondary | ICD-10-CM | POA: Diagnosis not present

## 2023-04-11 DIAGNOSIS — M810 Age-related osteoporosis without current pathological fracture: Secondary | ICD-10-CM | POA: Diagnosis not present

## 2023-04-12 DIAGNOSIS — R413 Other amnesia: Secondary | ICD-10-CM | POA: Diagnosis not present

## 2023-04-22 ENCOUNTER — Encounter: Payer: Self-pay | Admitting: Physician Assistant

## 2023-04-22 ENCOUNTER — Ambulatory Visit: Payer: Medicare HMO

## 2023-04-22 ENCOUNTER — Ambulatory Visit: Payer: Medicare HMO | Admitting: Physician Assistant

## 2023-04-22 VITALS — BP 137/77 | HR 62 | Resp 18 | Wt 141.0 lb

## 2023-04-22 DIAGNOSIS — R413 Other amnesia: Secondary | ICD-10-CM

## 2023-04-22 NOTE — Patient Instructions (Addendum)
It was a pleasure to see you today at our office.   Recommendations:  Neurocognitive evaluation at our office   MRI of the brain, the radiology office will call you to arrange you appointment   Follow up in 3  months Continue vitamin B12 1000 mcg daily.    Continue donepezil 10 mg daily. Side effects were discussed    For psychiatric meds, mood meds: Please have your primary care physician manage these medications.  If you have any severe symptoms of a stroke, or other severe issues such as confusion,severe chills or fever, etc call 911 or go to the ER as you may need to be evaluated further  For guidance regarding [facility] and if placement were needed at the facility, contact Social Worker tel: [phone]  For assessment of decision of mental capacity and competency:  Call Dr. Erick Blinks, geriatric psychiatrist at [phone]  Counseling regarding caregiver distress, including caregiver depression, anxiety and issues regarding community resources, adult day care programs, adult living facilities, or memory care questions:  please contact your  Primary Doctor's Social Worker   Whom to call: Memory  decline, memory medications: Call our office [phone]    [email or web address]   RECOMMENDATIONS FOR ALL PATIENTS WITH MEMORY PROBLEMS: 1. Continue to exercise (Recommend 30 minutes of walking everyday, or 3 hours every week) 2. Increase social interactions - continue going to [place] and enjoy social gatherings with friends and family 3. Eat healthy, avoid fried foods and eat more fruits and vegetables 4. Maintain adequate blood pressure, blood sugar, and blood cholesterol level. Reducing the risk of stroke and cardiovascular disease also helps promoting better memory. 5. Avoid stressful situations. Live a simple life and avoid aggravations. Organize your time and prepare for the next day in  anticipation. 6. Sleep well, avoid any interruptions of sleep and avoid any distractions in the bedroom that may interfere with adequate sleep quality 7. Avoid sugar, avoid sweets as there is a strong link between excessive sugar intake, diabetes, and cognitive impairment We discussed the Mediterranean diet, which has been shown to help patients reduce the risk of progressive memory disorders and reduces cardiovascular risk. This includes eating fish, eat fruits and green leafy vegetables, nuts like almonds and hazelnuts, walnuts, and also use olive oil. Avoid fast foods and fried foods as much as possible. Avoid sweets and sugar as sugar use has been linked to worsening of memory function.  There is always a concern of gradual progression of memory problems. If this is the case, then we may need to adjust level of care according to patient needs. Support, both to the patient and caregiver, should then be put into place.         FALL PRECAUTIONS: Be cautious when walking. Scan the area for obstacles that may increase the risk of trips and falls. When getting up in the mornings, sit up at the edge of the bed for a few minutes before getting out of bed. Consider elevating the bed at the head end to avoid drop of blood pressure when getting up. Walk always in a well-lit room (use night lights in the walls). Avoid area rugs or power cords from appliances in the middle of the walkways. Use a walker or a cane if necessary and consider physical therapy for balance exercise. Get your eyesight checked regularly.  FINANCIAL OVERSIGHT: Supervision, especially oversight when making financial decisions or transactions is also recommended.  HOME SAFETY: Consider the safety of the kitchen when  operating appliances like stoves, microwave oven, and blender. Consider having supervision and share cooking responsibilities until no longer able to participate in those. Accidents with firearms and other hazards in the house  should be identified and addressed as well.   ABILITY TO BE LEFT ALONE: If patient is unable to contact 911 operator, consider using LifeLine, or when the need is there, arrange for someone to stay with patients. Smoking is a fire hazard, consider supervision or cessation. Risk of wandering should be assessed by caregiver and if detected at any point, supervision and safe proof recommendations should be instituted.  MEDICATION SUPERVISION: Inability to self-administer medication needs to be constantly addressed. Implement a mechanism to ensure safe administration of the medications.      Mediterranean Diet A Mediterranean diet refers to food and lifestyle choices that are based on the traditions of countries located on the [employer] [place]. This way of eating has been shown to help prevent certain conditions and improve outcomes for people who have chronic diseases, like kidney disease and heart disease. What are tips for following this plan? Lifestyle  Cook and eat meals together with your family, when possible. Drink enough fluid to keep your urine clear or pale yellow. Be physically active every day. This includes: Aerobic exercise like running or swimming. Leisure activities like gardening, walking, or housework. Get 7-8 hours of sleep each night. If recommended by your health care provider, drink red wine in moderation. This means 1 glass a day for nonpregnant women and 2 glasses a day for men. A glass of wine equals 5 oz (150 mL). Reading food labels  Check the serving size of packaged foods. For foods such as rice and pasta, the serving size refers to the amount of cooked product, not dry. Check the total fat in packaged foods. Avoid foods that have saturated fat or trans fats. Check the ingredients list for added sugars, such as corn syrup. Shopping  At the grocery store, buy most of your food from the areas near the walls of the store. This includes: Fresh fruits and vegetables  (produce). Grains, beans, nuts, and seeds. Some of these may be available in unpackaged forms or large amounts (in bulk). Fresh seafood. Poultry and eggs. Low-fat dairy products. Buy whole ingredients instead of prepackaged foods. Buy fresh fruits and vegetables in-season from local farmers markets. Buy frozen fruits and vegetables in resealable bags. If you do not have access to quality fresh seafood, buy precooked frozen shrimp or canned fish, such as tuna, salmon, or sardines. Buy small amounts of raw or cooked vegetables, salads, or olives from the deli or salad bar at your store. Stock your pantry so you always have certain foods on hand, such as olive oil, canned tuna, canned tomatoes, rice, pasta, and beans. Cooking  Cook foods with extra-virgin olive oil instead of using butter or other vegetable oils. Have meat as a side dish, and have vegetables or grains as your Curd dish. This means having meat in small portions or adding small amounts of meat to foods like pasta or stew. Use beans or vegetables instead of meat in common dishes like chili or lasagna. Experiment with different cooking methods. Try roasting or broiling vegetables instead of steaming or sauteing them. Add frozen vegetables to soups, stews, pasta, or rice. Add nuts or seeds for added healthy fat at each meal. You can add these to yogurt, salads, or vegetable dishes. Marinate fish or vegetables using olive oil, lemon juice, garlic, and fresh herbs.  Meal planning  Plan to eat 1 vegetarian meal one day each week. Try to work up to 2 vegetarian meals, if possible. Eat seafood 2 or more times a week. Have healthy snacks readily available, such as: Vegetable sticks with hummus. Greek yogurt. Fruit and nut trail mix. Eat balanced meals throughout the week. This includes: Fruit: 2-3 servings a day Vegetables: 4-5 servings a day Low-fat dairy: 2 servings a day Fish, poultry, or lean meat: 1 serving a day Beans and  legumes: 2 or more servings a week Nuts and seeds: 1-2 servings a day Whole grains: 6-8 servings a day Extra-virgin olive oil: 3-4 servings a day Limit red meat and sweets to only a few servings a month What are my food choices? Mediterranean diet Recommended Grains: Whole-grain pasta. Brown rice. Bulgar wheat. Polenta. Couscous. Whole-wheat bread. Orpah Cobb. Vegetables: Artichokes. Beets. Broccoli. Cabbage. Carrots. Eggplant. Green beans. Chard. Kale. Spinach. Onions. Leeks. Peas. Squash. Tomatoes. Peppers. Radishes. Fruits: Apples. Apricots. Avocado. Berries. Bananas. Cherries. Dates. Figs. Grapes. Lemons. Melon. Oranges. Peaches. Plums. Pomegranate. Meats and other protein foods: Beans. Almonds. Sunflower seeds. Pine nuts. Peanuts. Cod. Salmon. Scallops. Shrimp. Tuna. Tilapia. Clams. Oysters. Eggs. Dairy: Low-fat milk. Cheese. Greek yogurt. Beverages: Water. Red wine. Herbal tea. Fats and oils: Extra virgin olive oil. Avocado oil. Grape seed oil. Sweets and desserts: [place] yogurt with honey. Baked apples. Poached pears. Trail mix. Seasoning and other foods: Basil. Cilantro. Coriander. Cumin. Mint. Parsley. Sage. Rosemary. Tarragon. Garlic. Oregano. Thyme. Pepper. Balsalmic vinegar. Tahini. Hummus. Tomato sauce. Olives. Mushrooms. Limit these Grains: Prepackaged pasta or rice dishes. Prepackaged cereal with added sugar. Vegetables: Deep fried potatoes (french fries). Fruits: Fruit canned in syrup. Meats and other protein foods: Beef. Pork. Lamb. Poultry with skin. Hot dogs. Tomasa Blase. Dairy: Ice cream. Sour cream. Whole milk. Beverages: Juice. Sugar-sweetened soft drinks. Beer. Liquor and spirits. Fats and oils: Butter. Canola oil. Vegetable oil. Beef fat (tallow). Lard. Sweets and desserts: Cookies. Cakes. Pies. Candy. Seasoning and other foods: Mayonnaise. Premade sauces and marinades. The items listed may not be a complete list. Talk with your dietitian about what dietary choices  are right for you. Summary The Mediterranean diet includes both food and lifestyle choices. Eat a variety of fresh fruits and vegetables, beans, nuts, seeds, and whole grains. Limit the amount of red meat and sweets that you eat. Talk with your health care provider about whether it is safe for you to drink red wine in moderation. This means 1 glass a day for nonpregnant women and 2 glasses a day for men. A glass of wine equals 5 oz (150 mL). This information is not intended to replace advice given to you by your health care provider. Make sure you discuss any questions you have with your health care provider. Document Released: 05/03/2016 Document Revised: 06/05/2016 Document Reviewed: 05/03/2016 Elsevier Interactive Patient Education  2017 [employer].     [facility] [phone]

## 2023-04-22 NOTE — Progress Notes (Signed)
Assessment/Plan:    The patient is seen in neurologic consultation at the request of Angelica Patience, FNP for the evaluation of memory.  Angelica Serrano is a very pleasant 82 y.o. year old RH female with a history of hypertension, hyperlipidemia, glaucoma, GAD, chronic fatigue, prediabetes, and initial diagnosis per neuropsychological evaluation of mild cognitive impairment possibly due to Alzheimer's disease (2021).  At the time, the patient was started on donepezil 10 mg daily.  Since that time, the patient failed to follow-up.  She is seen today for evaluation of worsening memory loss.  MoCA today is 13/30.Patient is able to participate on his IADLs.     Mild cognitive impairment likely due to Alzheimer disease  MRI brain without contrast to assess for underlying structural abnormality and assess vascular load  Neurocognitive testing to further evaluate cognitive concerns and determine other underlying cause of memory changes, including potential contribution from sleep, anxiety, or depression  Check B12, TSH Recommend good control of cardiovascular risk factors.   Continue to control mood as per PCP Fol[place]ow up in    Subjective:    The patient is accompanied by her daughter Angelica Serrano who supplements the history.    How long did patient have memory difficulties?  She continues to have difficulty with conversations, names of people.  Daughter reports that around 2022 until 2023 she had reached a plateau regarding her memory, but over the last 6 months, the daughter reports some decline, which may be coincidental with her moving to somebody's house.  Daughter is unsure if that affected her cognitive status or not.  Although she likes to be with her family, there is no other social in the interaction.  She likes to do puzzles, draw.  Repeats oneself?  Endorsed Disoriented when walking into a room?  As mentioned before, the patient has moved to somebody else's house, and has "been normal  disorientation of what things may be ". Leaving objects in unusual places?  denies   Wandering behavior? denies   Any personality changes ?  She may have some moments of irritability.  She states "sometimes when they cannot keep up with me I might be more irritable "-she jokes (daughter reports that the patient is very sarcastic in general) Any history of depression?: denies patient has a history of anxiety, and possibly situational depression.  Daughter reports that this may commensurate with her memory loss. Hallucinations or paranoia? denies   Seizures? denies    Any sleep changes?  Sleeps well, about 5 to 6 hours.  Most of the time she feels rested.  Denies frequent vivid dreams, REM behavior or sleepwalking   Sleep apnea? denies   Any hygiene concerns?  denies   Independent of bathing and dressing? Endorsed  Does the patient need help with medications?  Patient is in charge, daughter supervises, as over the last 3 or 4 weeks, she may forget some doses. Who is in charge of the finances? Bills are on autopay     Any changes in appetite?  Her appetite may be decreased, although daughter is suspicious that she may be forgetting to eat.  She admits to not drinking enough water.    Patient have trouble swallowing?  denies   Does the patient cook? No  Any headaches?  denies   Chronic pain? denies   Ambulates with difficulty? Denies      Recent falls or head injuries? denies     Vision changes?  She uses eyedrops but no  worsening vision. Unilateral weakness, numbness or tingling? denies   Any tremors? denies Any anosmia? denies   Any incontinence of urine? denies   Any bowel dysfunction? denies      History of heavy alcohol intake? denies   History of heavy tobacco use? denies   Family history of dementia?  Denies  Does patient drive? No patient  Initial visit on November 2021   HISTORY OF PRESENT ILLNESS: This is a pleasant 82 year old left-handed woman with a history of hyperlipemia,  osteoporosis, glaucoma, presenting for evaluation of Mild Neurocognitive Disorder. Her daughter-in-law [name] is present to provide additional information. She feels her memory is a little bad "but not where it bothers me." She cannot answer a question quickly when put on the spot, but eventually can answer it. She lives alone. She has reduced driving due to her vision, denies getting lost driving. She denies missing medications. She has a chart for daily eye drop intake. She denies missing bill payments. She has not left the stove on. She denies misplacing things frequently. Family started noticing changes a couple of years ago where she would repeat the same stories. She was more forgetful, for instance asking if she drove or they drove to a baseball game they attended. Over the past year with the pandemic, she was not multitasking as much. No hygiene concerns, she is independent with dressing and bathing. There was also more anxiety and insomnia. The anxiety has improved. She has difficulty with sleep maintenance, she wakes up and thinks of so many things. She usually gets 8 hours of sleep and does not take naps. Mood is "pretty darn good." No personality changes, paranoia, or hallucinations. No family history of dementia. She has had several head injuries in her teens where she lost consciousness. She rarely drinks alcohol. She works on puzzles regularly and likes to draw and sketch.    She underwent Neuropsychological testing in October 2021, results reviewed. There was note of prominent impairments across verbal memory, along with additional deficits in working memory and semantic fluency. She met criteria for Mild Neurocognitive Disorder, but likely towards the moderate to moderate-severe end of spectrum. Etiology was concerning for Alzheimer's disease, however pattern was not wholly consistent with AD (performed well across confrontation naming task and had retention of visual information).     No Known  Allergies  Current Outpatient Medications  Medication Instructions   alendronate (FOSAMAX) 70 mg, Weekly   atorvastatin (LIPITOR) 10 mg, Daily   bimatoprost (LUMIGAN) 0.03 % ophthalmic solution 1 drop, Daily at bedtime   Brimonidine Tartrate-Timolol (COMBIGAN OP) 1 drop, 2 times daily   cyanocobalamin (VITAMIN B12) 1,000 mcg, Daily   donepezil (ARICEPT) 5 mg, Oral, Daily at bedtime   dorzolamide (TRUSOPT) 2 % ophthalmic solution 1 drop, 2 times daily   Omega-3 Fatty Acids (FISH OIL PO) 1 capsule, 3 times daily     VITALS:   Vitals:   04/22/23 0816  BP: 137/77  Pulse: 62  Resp: 18  SpO2: 95%  Weight: 141 lb (64 kg)       No data to display          PHYSICAL EXAM   HEENT:  Normocephalic, atraumatic.  The superficial temporal arteries are without ropiness or tenderness. Cardiovascular: Regular rate and rhythm. Lungs: Clear to auscultation bilaterally. Neck: There are no carotid bruits noted bilaterally.  NEUROLOGICAL:    04/23/2023    5:00 PM  Montreal Cognitive Assessment   Visuospatial/ Executive (0/5) 2  Naming (0/3) 3  Attention: Read list of digits (0/2) 2  Attention: Read list of letters (0/1) 1  Attention: Serial 7 subtraction starting at 100 (0/3) 0  Language: Repeat phrase (0/2) 1  Language : Fluency (0/1) 1  Abstraction (0/2) 0  Delayed Recall (0/5) 0  Orientation (0/6) 3  Total 13  Adjusted Score (based on education) 13        No data to display           Orientation:  Alert and oriented to person, place and not to time. No aphasia or dysarthria. Fund of knowledge is appropriate. Recent and remote memory impaired.  Attention and concentration are normal reduced.  Able to name objects and repeat phrases 1/2. Delayed recall 0/5 Cranial nerves: There is good facial symmetry. Extraocular muscles are intact and visual fields are full to confrontational testing. Speech is fluent and clear. No tongue deviation. Hearing is intact to conversational  tone. Tone: Tone is good throughout. Sensation: Sensation is intact to light touch.  Vibration is intact at the bilateral big toe.  Coordination: The patient has no difficulty with RAM's or FNF bilaterally. Normal finger to nose  Motor: Strength is 5/5 in the bilateral upper and lower extremities. There is no pronator drift. There are no fasciculations noted. DTR's: Deep tendon reflexes are1/4 bilaterally. Gait and Station: The patient is able to ambulate without difficulty. Gait is cautious and narrow. Stride length is normal        Thank you for allowing [place] the opportunity to participate in the care of this nice patient. Please do not hesitate to contact us for any questions or concerns.   Total time spent on today's visit was 60 minutes dedicated to this patient today, preparing to see patient, examining the patient, ordering tests and/or medications and counseling the patient, documenting clinical information in the EHR or other health record, independently interpreting results and communicating results to the patient/family, discussing treatment and goals, answering patient's questions and coordinating care.  Cc:  [facility], [facility]  [name] 04/23/2023 5:11 PM

## 2023-04-25 ENCOUNTER — Ambulatory Visit: Payer: Medicare HMO | Admitting: Psychology

## 2023-04-25 ENCOUNTER — Encounter: Payer: Self-pay | Admitting: Psychology

## 2023-04-25 DIAGNOSIS — F0280 Dementia in other diseases classified elsewhere without behavioral disturbance: Secondary | ICD-10-CM

## 2023-04-25 DIAGNOSIS — J309 Allergic rhinitis, unspecified: Secondary | ICD-10-CM | POA: Insufficient documentation

## 2023-04-25 DIAGNOSIS — R4189 Other symptoms and signs involving cognitive functions and awareness: Secondary | ICD-10-CM

## 2023-04-25 DIAGNOSIS — G309 Alzheimer's disease, unspecified: Secondary | ICD-10-CM | POA: Diagnosis not present

## 2023-04-25 DIAGNOSIS — J209 Acute bronchitis, unspecified: Secondary | ICD-10-CM

## 2023-04-25 DIAGNOSIS — F411 Generalized anxiety disorder: Secondary | ICD-10-CM | POA: Diagnosis not present

## 2023-04-25 DIAGNOSIS — J0140 Acute pansinusitis, unspecified: Secondary | ICD-10-CM

## 2023-04-25 HISTORY — DX: Acute pansinusitis, unspecified: J01.40

## 2023-04-25 HISTORY — DX: Dementia in other diseases classified elsewhere, unspecified severity, without behavioral disturbance, psychotic disturbance, mood disturbance, and anxiety: F02.80

## 2023-04-25 HISTORY — DX: Acute bronchitis, unspecified: J20.9

## 2023-04-25 NOTE — Progress Notes (Signed)
   Psychometrician Note   Cognitive testing was administered to Angelica Serrano by Wallace Keller, B.S. (psychometrist) under the supervision of Dr. Newman Nickels, Ph.D., licensed psychologist on 04/25/2023. Angelica Serrano did not appear overtly distressed by the testing session per behavioral observation or responses across self-report questionnaires. Rest breaks were offered.    The battery of tests administered was selected by Dr. Newman Nickels, Ph.D. with consideration to Angelica Serrano's current level of functioning, the nature of her symptoms, emotional and behavioral responses during interview, level of literacy, observed level of motivation/effort, and the nature of the referral question. This battery was communicated to the psychometrist. Communication between Dr. Newman Nickels, Ph.D. and the psychometrist was ongoing throughout the evaluation and Dr. Newman Nickels, Ph.D. was immediately accessible at all times. Dr. Newman Nickels, Ph.D. provided supervision to the psychometrist on the date of this service to the extent necessary to assure the quality of all services provided.    Angelica Serrano will return within approximately 1-2 weeks for an interactive feedback session with Dr. Milbert Coulter at which time her test performances, clinical impressions, and treatment recommendations will be reviewed in detail. Angelica Serrano understands she can contact our office should she require our assistance before this time.  A total of 110 minutes of billable time were spent face-to-face with Angelica Serrano by the psychometrist. This includes both test administration and scoring time. Billing for these services is reflected in the clinical report generated by Dr. Newman Nickels, Ph.D.  This note reflects time spent with the psychometrician and does not include test scores or any clinical interpretations made by Dr. Milbert Coulter. The full report will follow in a separate note.

## 2023-04-25 NOTE — Progress Notes (Addendum)
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 completed a clinical interview with Dr. Milbert Coulter, billed as one unit [phone], and 110 minutes of cognitive testing and scoring, billed as one unit [id number] and three additional units [id number]. Psychometrist Wallace Keller, B.S. assisted Dr. Milbert Coulter with test administration and scoring procedures. As a separate and discrete service, one unit [id number] and two units [phone] were billed for Dr. Tammy Sours time spent in interpretation and report writing.

## 2023-05-01 ENCOUNTER — Encounter: Payer: Self-pay | Admitting: Physician Assistant

## 2023-05-03 ENCOUNTER — Telehealth (INDEPENDENT_AMBULATORY_CARE_PROVIDER_SITE_OTHER): Payer: Medicare HMO | Admitting: Psychology

## 2023-05-03 DIAGNOSIS — F0280 Dementia in other diseases classified elsewhere without behavioral disturbance: Secondary | ICD-10-CM | POA: Diagnosis not present

## 2023-05-03 DIAGNOSIS — G309 Alzheimer's disease, unspecified: Secondary | ICD-10-CM | POA: Diagnosis not present

## 2023-05-03 NOTE — Progress Notes (Signed)
   Neuropsychology Feedback Session [name]. [name] Department of Neurology  Reason for Referral:   Angelica Serrano is a 82 y.o. 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   Feedback:   [name] completed a comprehensive neuropsychological evaluation on 04/25/2023. Please refer to that encounter for the full report and recommendations. Briefly, results suggested severe impairment surrounding all aspects of learning and memory. Additional impairments were exhibited across processing speed, cognitive flexibility, and clock drawing. Further variability was exhibited across verbal fluency. Relative to her previous evaluation in October 2021, significant decline was exhibited across processing speed, cognitive flexibility, phonemic fluency, clock drawing, and aspects of visual memory. Severe verbal memory impairment was exhibited across both evaluations. Regarding etiology, concerns for underlying Alzheimer's disease were expressed at the time of her previous evaluation. This continues to represent my primary concern at the present time. Across memory testing, [name] was fully amnestic (i.e., 0% retention) across all memory tasks and performed very poorly across yes/no recognition trials. This suggests rapid forgetting and a pronounced storage impairment, both of which represent the hallmark testing patterns of this illness. Weakness in semantic fluency and cognitive flexibility would follow typical disease progression. Objective evidence for gradual progressive decline surrounding both cognitive and functional abilities further aligns with a degenerative process such as this illness. Intact confrontation naming remains encouraging. 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   The current virtual feedback session was conducted with [name] children with her implied permission. They were within her residence while I was within my office. I discussed the limitations of evaluation and management by telemedicine and the availability of in person appointments. They expressed their understanding and agreed to proceed. Content of the current session focused on the results of her neuropsychological evaluation. [name] family were given the opportunity to ask questions and their questions were answered. They were encouraged to reach out should additional questions arise. A copy of her report was mailed at the conclusion of the visit.      One unit [phone] was billed for Dr. Tammy Sours time spent preparing for, conducting, and documenting the current feedback session with [name].

## 2023-05-08 ENCOUNTER — Ambulatory Visit
Admission: RE | Admit: 2023-05-08 | Discharge: 2023-05-08 | Disposition: A | Discharge status: Home / Self Care | Payer: Medicare HMO | Source: Ambulatory Visit | Attending: Physician Assistant | Admitting: Physician Assistant

## 2023-05-08 DIAGNOSIS — R413 Other amnesia: Secondary | ICD-10-CM | POA: Diagnosis not present

## 2023-05-23 NOTE — Progress Notes (Signed)
MRI of the brain without changes since the one performed in 2021.  No acute findings.  Age-related changes as before on the volume and circulation.  No strokes, masses, fluid, or infection.

## 2023-05-30 DIAGNOSIS — F0280 Dementia in other diseases classified elsewhere without behavioral disturbance: Secondary | ICD-10-CM | POA: Diagnosis not present

## 2023-05-30 DIAGNOSIS — J4530 Mild persistent asthma, uncomplicated: Secondary | ICD-10-CM | POA: Diagnosis not present

## 2023-05-30 DIAGNOSIS — Z0000 Encounter for general adult medical examination without abnormal findings: Secondary | ICD-10-CM | POA: Diagnosis not present

## 2023-05-30 DIAGNOSIS — E785 Hyperlipidemia, unspecified: Secondary | ICD-10-CM | POA: Diagnosis not present

## 2023-05-30 DIAGNOSIS — R7303 Prediabetes: Secondary | ICD-10-CM | POA: Diagnosis not present

## 2023-05-30 DIAGNOSIS — F418 Other specified anxiety disorders: Secondary | ICD-10-CM | POA: Diagnosis not present

## 2023-05-30 DIAGNOSIS — G300 Alzheimer's disease with early onset: Secondary | ICD-10-CM | POA: Diagnosis not present

## 2023-05-30 DIAGNOSIS — I10 Essential (primary) hypertension: Secondary | ICD-10-CM | POA: Diagnosis not present

## 2023-05-30 DIAGNOSIS — H409 Unspecified glaucoma: Secondary | ICD-10-CM | POA: Diagnosis not present

## 2023-05-30 DIAGNOSIS — Z23 Encounter for immunization: Secondary | ICD-10-CM | POA: Diagnosis not present

## 2023-07-24 ENCOUNTER — Encounter: Payer: Self-pay | Admitting: Physician Assistant

## 2023-07-24 ENCOUNTER — Ambulatory Visit: Payer: Medicare HMO | Admitting: Physician Assistant

## 2023-07-24 VITALS — BP 128/62 | HR 60 | Resp 18 | Wt 140.0 lb

## 2023-07-24 DIAGNOSIS — F0280 Dementia in other diseases classified elsewhere without behavioral disturbance: Secondary | ICD-10-CM | POA: Diagnosis not present

## 2023-07-24 DIAGNOSIS — G309 Alzheimer's disease, unspecified: Secondary | ICD-10-CM | POA: Diagnosis not present

## 2023-07-24 NOTE — Progress Notes (Signed)
Assessment/Plan:   Dementia likely due to Alzheimer's disease  Angelica Serrano is a very pleasant 82 y.o. RH female with a history off hypertension, hyperlipidemia, glaucoma, GAD, chronic fatigue, prediabetes and a diagnosis of dementia likely due to Alzheimer's disease per neuropsych evaluation August 2024 seen today in follow up for memory loss. Patient is currently on donepezil 10 mg daily. Most recent MRI of the brain 04/22/2023 personally reviewed remarkable for generalized volume loss, and mild to moderate chronic small vessel ischemic changes similar to those of 2021, no apparent progression. She is able to participate on ADLs, no longer drives.   Follow up in  6 months. Agree with donepezil to 10 mg daily, side effects discussed Recommend good control of her cardiovascular risk factors Continue to control mood as per PCP     Subjective:    This patient is accompanied in the office by her daughter who supplements the history.  Previous records as well as any outside records available were reviewed prior to todays visit. Patient was last seen on 04/23/23 with [facility]13/30    Any changes in memory since last visit? "Not better, maybe a little worse"-daughter said.  She has difficulty with word finding, conversations, names of people.  She likes to do puzzles. repeats oneself?  Endorsed Disoriented when walking into a room?  Patient denies    Leaving objects in unusual places?  May misplace things but not in unusual places   Wandering behavior?  denies   Any personality changes since last visit?  She has moments of irritability. Any worsening depression?:  She has a history of anxiety, improved since moving with her son. Hallucinations or paranoia?  Denies.   Seizures? denies    Any sleep changes?  She sleeps overall well although she  had a couple of night terrors, thinking it were 2 men in the room, last episode 1 week ago, no recurrence  No sleepwalking. Sleep apnea?   Denies.    Any hygiene concerns? Denies.  Independent of bathing and dressing?  Endorsed  Does the patient needs help with medications?  Patient is in charge, daughter monitors.  Who is in charge of the finances?  Daughter is in charge, but bills are auto-pay Any changes in appetite?  There is suspicious that she may be forgetting to eat although patient says that she is not hungry.  She admits to not drinking enough water.    Patient have trouble swallowing? Denies.   Does the patient cook? No Any headaches?   denies   Chronic back pain  denies   Ambulates with difficulty? Denies.  Walking daily  Recent falls or head injuries? denies     Unilateral weakness, numbness or tingling? denies   Any tremors?  Denies   Any anosmia?  Denies   Any incontinence of urine?  Denies Any bowel dysfunction?   Denies      Patient lives with  her son since August 1 st  Does the patient drive? No longer drives    Neuropsych evaluation August 2024. Briefly, results suggested severe impairment surrounding all aspects of learning and memory. Additional impairments were exhibited across processing speed, cognitive flexibility, and clock drawing. Further variability was exhibited across verbal fluency. Relative to her previous evaluation in October 2021, significant decline was exhibited across processing speed, cognitive flexibility, phonemic fluency, clock drawing, and aspects of visual memory. Severe verbal memory impairment was exhibited across both evaluations. Regarding etiology, concerns for underlying Alzheimer's disease were expressed  at the time of her previous evaluation. This continues to represent my primary concern at the present time. Across memory testing, Angelica Serrano was fully amnestic (i.e., 0% retention) across all memory tasks and performed very poorly across yes/no recognition trials. This suggests rapid forgetting and a pronounced storage impairment, both of which represent the hallmark testing patterns of this  illness. Weakness in semantic fluency and cognitive flexibility would follow typical disease progression. Objective evidence for gradual progressive decline surrounding both cognitive and functional abilities further aligns with a degenerative process such as this illness. Intact confrontation naming remains encouraging. 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    Initial visit on November 2021    This is a pleasant 82 year old left-handed woman with a history of hyperlipemia, osteoporosis, glaucoma, presenting for evaluation of Mild Neurocognitive Disorder. Her daughter-in-law Angelica Serrano is present to provide additional information. She feels her memory is a little bad "but not where it bothers me." She cannot answer a question quickly when put on the spot, but eventually can answer it. She lives alone. She has reduced driving due to her vision, denies getting lost driving. She denies missing medications. She has a chart for daily eye drop intake. She denies missing bill payments. She has not left the stove on. She denies misplacing things frequently. Family started noticing changes a couple of years ago where she would repeat the same stories. She was more forgetful, for instance asking if she drove or they drove to a baseball game they attended. Over the past year with the pandemic, she was not multitasking as much. No hygiene concerns, she is independent with dressing and bathing. There was also more anxiety and insomnia. The anxiety has improved. She has difficulty with sleep maintenance, she wakes up and thinks of so many things. She usually gets 8 hours of sleep and does not take naps. Mood is "pretty darn good." No personality changes, paranoia, or hallucinations. No family history of dementia. She has had several head injuries in her teens where she lost consciousness. She rarely drinks alcohol. She works on puzzles  regularly and likes to draw and sketch.    She underwent Neuropsychological testing in October 2021, results reviewed. There was note of prominent impairments across verbal memory, along with additional deficits in working memory and semantic fluency. She met criteria for Mild Neurocognitive Disorder, but likely towards the moderate to moderate-severe end of spectrum. Etiology was concerning for Alzheimer's disease, however pattern was not wholly consistent with AD (performed well across confrontation naming task and had retention of visual information).    Visit 04/22/2023 How long did patient have memory difficulties?  She continues to have difficulty with conversations, names of people.  Daughter reports that around 2022 until 2023 she had reached a plateau regarding her memory, but over the last 6 months, the daughter reports some decline, which may be coincidental with her moving to somebody's house.  Daughter is unsure if that affected her cognitive status or not.  Although she likes to be with her family, there is no other social in the interaction.  She likes to do puzzles, draw.  Repeats oneself?  Endorsed Disoriented when walking into a room?  As mentioned before, the patient has moved to somebody else's house, and has "been normal disorientation of what things may be ". Leaving objects in unusual places?  denies   Wandering behavior? denies   Any personality changes ?  She may have some moments of irritability.  She states "sometimes when they cannot keep up with me I might be more irritable "-she jokes (daughter reports that the patient is very sarcastic in general) Any history of depression?:  patient has a history of anxiety, and possibly situational depression.  Daughter reports that this may commensurate with her memory loss. Hallucinations or paranoia? denies   Seizures? denies    Any sleep changes?  Sleeps well, about 5 to 6 hours.  Most of the time she feels rested.  Denies frequent  vivid dreams, REM behavior or sleepwalking   Sleep apnea? denies   Any hygiene concerns?  denies   Independent of bathing and dressing? Endorsed  Does the patient need help with medications?  Patient is in charge, daughter supervises, as over the last 3 or 4 weeks, she may forget some doses. Who is in charge of the finances? Bills are on autopay     Any changes in appetite?  Her appetite may be decreased, although daughter is suspicious that she may be forgetting to eat.  She admits to not drinking enough water.    Patient have trouble swallowing?  denies   Does the patient cook? No  Any headaches?  denies   Chronic pain? denies   Ambulates with difficulty? Denies      Recent falls or head injuries? denies     Vision changes?  She uses eyedrops but no worsening vision. Unilateral weakness, numbness or tingling? denies   Any tremors? denies Any anosmia? denies   Any incontinence of urine? denies   Any bowel dysfunction? denies      History of heavy alcohol intake? denies   History of heavy tobacco use? denies   Family history of dementia?  Denies  Does patient drive? No   PREVIOUS MEDICATIONS:   CURRENT MEDICATIONS:  Outpatient Encounter Medications as of 07/24/2023  Medication Sig   cholecalciferol (VITAMIN D3) 25 MCG (1000 UNIT) tablet Take 1,000 Units by mouth daily.   donepezil (ARICEPT) 5 MG tablet Take 1 tablet (5 mg total) by mouth at bedtime. (Patient taking differently: Take 10 mg by mouth at bedtime. 10mg  I tab po qd)   vitamin B-12 (CYANOCOBALAMIN) 1000 MCG tablet Take 1,000 mcg by mouth daily.   alendronate (FOSAMAX) 70 MG tablet Take 70 mg by mouth once a week. Take with a full glass of water on an empty stomach. (Patient not taking: Reported on 04/22/2023)   atorvastatin (LIPITOR) 10 MG tablet Take 10 mg by mouth daily. (Patient not taking: Reported on 04/22/2023)   bimatoprost (LUMIGAN) 0.03 % ophthalmic solution Place 1 drop into the right eye at bedtime. (Patient not  taking: Reported on 04/22/2023)   Brimonidine Tartrate-Timolol (COMBIGAN OP) Place 1 drop into the right eye 2 (two) times daily. (Patient not taking: Reported on 04/22/2023)   dorzolamide (TRUSOPT) 2 % ophthalmic solution Place 1 drop into the right eye 2 (two) times daily.  (Patient not taking: Reported on 04/22/2023)   Omega-3 Fatty Acids (FISH OIL PO) Take 1 capsule by mouth 3 (three) times daily. (Patient not taking: Reported on 04/22/2023)   No facility-administered encounter medications on file as of 07/24/2023.        No data to display            04/23/2023    5:00 PM  [facility] Cognitive Assessment   Visuospatial/ Executive (0/5) 2  Naming (0/3) 3  Attention: Read list of digits (0/2) 2  Attention: Read  list of letters (0/1) 1  Attention: Serial 7 subtraction starting at 100 (0/3) 0  Language: Repeat phrase (0/2) 1  Language : Fluency (0/1) 1  Abstraction (0/2) 0  Delayed Recall (0/5) 0  Orientation (0/6) 3  Total 13  Adjusted Score (based on education) 13    Objective:     PHYSICAL EXAMINATION:    VITALS:   Vitals:   07/24/23 1534  BP: 128/62  Pulse: 60  Resp: 18  SpO2: 95%  Weight: 140 lb (63.5 kg)    GEN:  The patient appears stated age and is in NAD. HEENT:  Normocephalic, atraumatic.   Neurological examination:  General: NAD, well-groomed, appears stated age. Orientation: The patient is alert. Oriented to person, place and not to date Cranial nerves: There is good facial symmetry.The speech is fluent and clear. No aphasia or dysarthria. Fund of knowledge is appropriate. Recent and remote memory are impaired. Attention and concentration are reduced.  Able to name objects and repeat phrases.  Hearing is intact to conversational tone.  Sensation: Sensation is intact to light touch throughout Motor: Strength is at least antigravity x4. DTR's1/4 in UE/LE     Movement examination: Tone: There is normal tone in the UE/LE Abnormal movements:  no tremor.   No myoclonus.  No asterixis.   Coordination:  There is no decremation with RAM's. Normal finger to nose  Gait and Station: The patient has no difficulty arising out of a deep-seated chair without the use of the hands. The patient's stride length is good.  Gait is cautious and narrow.    Thank you for allowing [place] the opportunity to participate in the care of this nice patient. Please do not hesitate to contact us for any questions or concerns.   Total time spent on today's visit was 28 minutes dedicated to this patient today, preparing to see patient, examining the patient, ordering tests and/or medications and counseling the patient, documenting clinical information in the EHR or other health record, independently interpreting results and communicating results to the patient/family, discussing treatment and goals, answering patient's questions and coordinating care.  Cc:  Camie Patience, FNP  [name] 07/24/2023 3:41 PM

## 2023-07-24 NOTE — Patient Instructions (Signed)
It was a pleasure to see you today at our office.   Recommendations:  Continue vitamin B12 1000 mcg daily.    Continue donepezil 10 mg daily. Side effects were discussed    For psychiatric meds, mood meds: Please have your primary care physician manage these medications.  If you have any severe symptoms of a stroke, or other severe issues such as confusion,severe chills or fever, etc call 911 or go to the ER as you may need to be evaluated further  For guidance regarding [facility] and if placement were needed at the facility, contact Social Worker tel: [phone]  For assessment of decision of mental capacity and competency:  Call Dr. Erick Blinks, geriatric psychiatrist at [phone]  Counseling regarding caregiver distress, including caregiver depression, anxiety and issues regarding community resources, adult day care programs, adult living facilities, or memory care questions:  please contact your  Primary Doctor's Social Worker   Whom to call: Memory  decline, memory medications: Call our office [phone]    [email or web address]   RECOMMENDATIONS FOR ALL PATIENTS WITH MEMORY PROBLEMS: 1. Continue to exercise (Recommend 30 minutes of walking everyday, or 3 hours every week) 2. Increase social interactions - continue going to [place] and enjoy social gatherings with friends and family 3. Eat healthy, avoid fried foods and eat more fruits and vegetables 4. Maintain adequate blood pressure, blood sugar, and blood cholesterol level. Reducing the risk of stroke and cardiovascular disease also helps promoting better memory. 5. Avoid stressful situations. Live a simple life and avoid aggravations. Organize your time and prepare for the next day in anticipation. 6. Sleep well, avoid any interruptions of sleep and avoid any distractions in the bedroom that may interfere with adequate sleep quality 7. Avoid  sugar, avoid sweets as there is a strong link between excessive sugar intake, diabetes, and cognitive impairment We discussed the Mediterranean diet, which has been shown to help patients reduce the risk of progressive memory disorders and reduces cardiovascular risk. This includes eating fish, eat fruits and green leafy vegetables, nuts like almonds and hazelnuts, walnuts, and also use olive oil. Avoid fast foods and fried foods as much as possible. Avoid sweets and sugar as sugar use has been linked to worsening of memory function.  There is always a concern of gradual progression of memory problems. If this is the case, then we may need to adjust level of care according to patient needs. Support, both to the patient and caregiver, should then be put into place.         FALL PRECAUTIONS: Be cautious when walking. Scan the area for obstacles that may increase the risk of trips and falls. When getting up in the mornings, sit up at the edge of the bed for a few minutes before getting out of bed. Consider elevating the bed at the head end to avoid drop of blood pressure when getting up. Walk always in a well-lit room (use night lights in the walls). Avoid area rugs or power cords from appliances in the middle of the walkways. Use a walker or a cane if necessary and consider physical therapy for balance exercise. Get your eyesight checked regularly.  FINANCIAL OVERSIGHT: Supervision, especially oversight when making financial decisions or transactions is also recommended.  HOME SAFETY: Consider the safety of the kitchen when operating appliances like stoves, microwave oven, and blender. Consider having supervision and share cooking responsibilities until no longer able to participate in those. Accidents with firearms and other hazards  in the house should be identified and addressed as well.   ABILITY TO BE LEFT ALONE: If patient is unable to contact 911 operator, consider using LifeLine, or when the  need is there, arrange for someone to stay with patients. Smoking is a fire hazard, consider supervision or cessation. Risk of wandering should be assessed by caregiver and if detected at any point, supervision and safe proof recommendations should be instituted.  MEDICATION SUPERVISION: Inability to self-administer medication needs to be constantly addressed. Implement a mechanism to ensure safe administration of the medications.      Mediterranean Diet A Mediterranean diet refers to food and lifestyle choices that are based on the traditions of countries located on the [employer]. This way of eating has been shown to help prevent certain conditions and improve outcomes for people who have chronic diseases, like kidney disease and heart disease. What are tips for following this plan? Lifestyle  Cook and eat meals together with your family, when possible. Drink enough fluid to keep your urine clear or pale yellow. Be physically active every day. This includes: Aerobic exercise like running or swimming. Leisure activities like gardening, walking, or housework. Get 7-8 hours of sleep each night. If recommended by your health care provider, drink red wine in moderation. This means 1 glass a day for nonpregnant women and 2 glasses a day for men. A glass of wine equals 5 oz (150 mL). Reading food labels  Check the serving size of packaged foods. For foods such as rice and pasta, the serving size refers to the amount of cooked product, not dry. Check the total fat in packaged foods. Avoid foods that have saturated fat or trans fats. Check the ingredients list for added sugars, such as corn syrup. Shopping  At the grocery store, buy most of your food from the areas near the walls of the store. This includes: Fresh fruits and vegetables (produce). Grains, beans, nuts, and seeds. Some of these may be available in unpackaged forms or large amounts (in bulk). Fresh seafood. Poultry and  eggs. Low-fat dairy products. Buy whole ingredients instead of prepackaged foods. Buy fresh fruits and vegetables in-season from local farmers markets. Buy frozen fruits and vegetables in resealable bags. If you do not have access to quality fresh seafood, buy precooked frozen shrimp or canned fish, such as tuna, salmon, or sardines. Buy small amounts of raw or cooked vegetables, salads, or olives from the deli or salad bar at your store. Stock your pantry so you always have certain foods on hand, such as olive oil, canned tuna, canned tomatoes, rice, pasta, and beans. Cooking  Cook foods with extra-virgin olive oil instead of using butter or other vegetable oils. Have meat as a side dish, and have vegetables or grains as your Butters dish. This means having meat in small portions or adding small amounts of meat to foods like pasta or stew. Use beans or vegetables instead of meat in common dishes like chili or lasagna. Experiment with different cooking methods. Try roasting or broiling vegetables instead of steaming or sauteing them. Add frozen vegetables to soups, stews, pasta, or rice. Add nuts or seeds for added healthy fat at each meal. You can add these to yogurt, salads, or vegetable dishes. Marinate fish or vegetables using olive oil, lemon juice, garlic, and fresh herbs. Meal planning  Plan to eat 1 vegetarian meal one day each week. Try to work up to 2 vegetarian meals, if possible. Eat seafood 2 or more times  a week. Have healthy snacks readily available, such as: Vegetable sticks with hummus. Greek yogurt. Fruit and nut trail mix. Eat balanced meals throughout the week. This includes: Fruit: 2-3 servings a day Vegetables: 4-5 servings a day Low-fat dairy: 2 servings a day Fish, poultry, or lean meat: 1 serving a day Beans and legumes: 2 or more servings a week Nuts and seeds: 1-2 servings a day Whole grains: 6-8 servings a day Extra-virgin olive oil: 3-4 servings a day Limit  red meat and sweets to only a few servings a month What are my food choices? Mediterranean diet Recommended Grains: Whole-grain pasta. Brown rice. Bulgar wheat. Polenta. Couscous. Whole-wheat bread. Orpah Cobb. Vegetables: Artichokes. Beets. Broccoli. Cabbage. Carrots. Eggplant. Green beans. Chard. Kale. Spinach. Onions. Leeks. Peas. Squash. Tomatoes. Peppers. Radishes. Fruits: Apples. Apricots. Avocado. Berries. Bananas. Cherries. Dates. Figs. Grapes. Lemons. Melon. Oranges. Peaches. Plums. Pomegranate. Meats and other protein foods: Beans. Almonds. Sunflower seeds. Pine nuts. Peanuts. Cod. Salmon. Scallops. Shrimp. Tuna. Tilapia. Clams. Oysters. Eggs. Dairy: Low-fat milk. Cheese. Greek yogurt. Beverages: Water. Red wine. Herbal tea. Fats and oils: Extra virgin olive oil. Avocado oil. Grape seed oil. Sweets and desserts: [place] yogurt with honey. Baked apples. Poached pears. Trail mix. Seasoning and other foods: Basil. Cilantro. Coriander. Cumin. Mint. Parsley. Sage. Rosemary. Tarragon. Garlic. Oregano. Thyme. Pepper. Balsalmic vinegar. Tahini. Hummus. Tomato sauce. Olives. Mushrooms. Limit these Grains: Prepackaged pasta or rice dishes. Prepackaged cereal with added sugar. Vegetables: Deep fried potatoes (french fries). Fruits: Fruit canned in syrup. Meats and other protein foods: Beef. Pork. Lamb. Poultry with skin. Hot dogs. Tomasa Blase. Dairy: Ice cream. Sour cream. Whole milk. Beverages: Juice. Sugar-sweetened soft drinks. Beer. Liquor and spirits. Fats and oils: Butter. Canola oil. Vegetable oil. Beef fat (tallow). Lard. Sweets and desserts: Cookies. Cakes. Pies. Candy. Seasoning and other foods: Mayonnaise. Premade sauces and marinades. The items listed may not be a complete list. Talk with your dietitian about what dietary choices are right for you. Summary The Mediterranean diet includes both food and lifestyle choices. Eat a variety of fresh fruits and vegetables, beans, nuts,  seeds, and whole grains. Limit the amount of red meat and sweets that you eat. Talk with your health care provider about whether it is safe for you to drink red wine in moderation. This means 1 glass a day for nonpregnant women and 2 glasses a day for men. A glass of wine equals 5 oz (150 mL). This information is not intended to replace advice given to you by your health care provider. Make sure you discuss any questions you have with your health care provider. Document Released: 05/03/2016 Document Revised: 06/05/2016 Document Reviewed: 05/03/2016 Elsevier Interactive Patient Education  2017 [employer].     [place] [facility] [phone]

## 2023-08-07 DIAGNOSIS — H4032X3 Glaucoma secondary to eye trauma, left eye, severe stage: Secondary | ICD-10-CM | POA: Diagnosis not present

## 2023-08-07 DIAGNOSIS — H59032 Cystoid macular edema following cataract surgery, left eye: Secondary | ICD-10-CM | POA: Diagnosis not present

## 2023-08-07 DIAGNOSIS — H401123 Primary open-angle glaucoma, left eye, severe stage: Secondary | ICD-10-CM | POA: Diagnosis not present

## 2024-01-22 DIAGNOSIS — D040 Carcinoma in situ of skin of lip: Secondary | ICD-10-CM | POA: Diagnosis not present

## 2024-01-22 DIAGNOSIS — D3701 Neoplasm of uncertain behavior of lip: Secondary | ICD-10-CM | POA: Diagnosis not present

## 2024-01-23 ENCOUNTER — Ambulatory Visit: Payer: Medicare HMO | Admitting: Physician Assistant

## 2024-01-23 VITALS — BP 141/68 | HR 41 | Ht 63.0 in | Wt 138.0 lb

## 2024-01-23 DIAGNOSIS — R001 Bradycardia, unspecified: Secondary | ICD-10-CM | POA: Diagnosis not present

## 2024-01-23 DIAGNOSIS — G309 Alzheimer's disease, unspecified: Secondary | ICD-10-CM | POA: Diagnosis not present

## 2024-01-23 DIAGNOSIS — F0280 Dementia in other diseases classified elsewhere without behavioral disturbance: Secondary | ICD-10-CM

## 2024-01-23 MED ORDER — MEMANTINE HCL 5 MG PO TABS: ORAL_TABLET | ORAL | 3 refills | Status: DC

## 2024-01-23 NOTE — Progress Notes (Signed)
 Assessment/Plan:   Dementia likely due to Alzheimer disease  Angelica Serrano is a very pleasant 83 y.o. RH female with a history of hypertension, hyperlipidemia, glaucoma, GAD, chronic fatigue, prediabetes and a diagnosis of dementia likely due to Alzheimer's disease per neuropsych evaluation August 2024 seen today in follow up for memory loss. Patient is currently on donepezil  10 mg daily, however her pulse today was as low as 41 with mildly elevated BP. She denies a history of Afib and her rate on exam regular, normal rhythm.She is asymptomatic for bradycardia. In view of this abnormal value, it could be possible that donepezil  could be the culprit. Instructed the patient to hold the medicine and call her PCP for further evaluation including EKG. If after discontinuing the  med pulse still low, then cardiac etiology will need to be further evaluated by the respective providers. If pulse improves, she may initiate memantine  5 mg bid instead, and if tolerated, can increase to 10 mg bid.     Follow up in  6 months. Recommend good control of her cardiovascular risk factors. Patient informed of her low Pulse today at 41  Follow bradycardia with PCP, may need Cards evaluation (as above described) Hold donepezil  and when pulse improves, start memantine  5 mg bid, may increase to 10 mg bid if tolerated (this was to be added to donepezil  but unfortunately may be the sole medication for memory on her regimen) Continue to control mood as per PCP     Subjective:    This patient is accompanied in the office by her daughter who supplements the history.  Previous records as well as any outside records available were reviewed prior to todays visit. Patient was last seen on 07/24/2023, last MoCA on 04/23/2023 was 13/30   Any changes in memory since last visit? "A little worse". She needs more help with the ADLS. She has difficulty with word finding conversations, names, she likes to do puzzles. Not reading or  doing crosswords, or her artwork anymore. If familiar, she remembers names otherwise she will not. "Proficient at compensating (confabulating)"-daughter says repeats oneself?  Endorsed Disoriented when walking into a room? Denies     Leaving objects?  May misplace things but not in unusual places.  "Supercareful in keeping her things around her, at reach".   Wandering behavior?  denies   Any personality changes since last visit?  She may have some moments of irritability. Any worsening depression?:  History of anxiety, well-controlled.  Denies depression. Very content especially since moving with her son  Hallucinations or paranoia?  Denies.   Seizures? denies    Any sleep changes?  Overall she sleeps well, occasionally she may experience bad dreams, no REM behavior or sleepwalking   Sleep apnea?   Denies.   Any hygiene concerns? Denies.  Independent of bathing and dressing?  Endorsed  Does the patient needs help with medications?  Son is in charge, daughter monitors  Who is in charge of the finances?  Daughter is in charge but bills are on autopay     Any changes in appetite?  denies. She needs to be reminded to eat or drink.  She admits to not drinking of water    Patient have trouble swallowing? Denies.   Does the patient cook? No Any headaches?   denies   Chronic back pain  denies   Ambulates with difficulty? Denies.  She walks daily  Recent falls or head injuries? Fall in the last 2 months, mechanical,  not having seen a curb.  Unilateral weakness, numbness or tingling? denies   Any tremors?  Denies   Any anosmia?  Denies   Any incontinence of urine?  Denies  Any bowel dysfunction?   Denies      Patient lives with her son  Does the patient drive? No longer drives       Initial visit 04/22/2023 How long did patient have memory difficulties?  She continues to have difficulty with conversations, names of people.  Daughter reports that around 2022 until 2023 she had reached a plateau  regarding her memory, but over the last 6 months, the daughter reports some decline, which may be coincidental with her moving to somebody's house.  Daughter is unsure if that affected her cognitive status or not.  Although she likes to be with her family, there is no other social in the interaction.  She likes to do puzzles, draw.  Repeats oneself?  Endorsed Disoriented when walking into a room?  As mentioned before, the patient has moved to somebody else's house, and has "been normal disorientation of what things may be ". Leaving objects in unusual places?  denies   Wandering behavior? denies   Any personality changes ?  She may have some moments of irritability.  She states "sometimes when they cannot keep up with me I might be more irritable "-she jokes (daughter reports that the patient is very sarcastic in general) Any history of depression?:  patient has a history of anxiety, and possibly situational depression.  Daughter reports that this may commensurate with her memory loss. Hallucinations or paranoia? denies   Seizures? denies    Any sleep changes?  Sleeps well, about 5 to 6 hours.  Most of the time she feels rested.  Denies frequent vivid dreams, REM behavior or sleepwalking   Sleep apnea? denies   Any hygiene concerns?  denies   Independent of bathing and dressing? Endorsed  Does the patient need help with medications?  Patient is in charge, daughter supervises, as over the last 3 or 4 weeks, she may forget some doses. Who is in charge of the finances? Bills are on autopay     Any changes in appetite?  Her appetite may be decreased, although daughter is suspicious that she may be forgetting to eat.  She admits to not drinking enough water.    Patient have trouble swallowing?  denies   Does the patient cook? No  Any headaches?  denies   Chronic pain? denies   Ambulates with difficulty? Denies      Recent falls or head injuries? denies     Vision changes?  She uses eyedrops but no  worsening vision. Unilateral weakness, numbness or tingling? denies   Any tremors? denies Any anosmia? denies   Any incontinence of urine? denies   Any bowel dysfunction? denies      History of heavy alcohol intake? denies   History of heavy tobacco use? denies   Family history of dementia?  Denies  Does patient drive? 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 Her daughter-in-law Louanna Rouse is present to provide additional information. She feels her memory is a little bad "but not where it bothers me." She cannot answer a question quickly when put on the spot, but eventually can answer it. She lives alone. She has  reduced driving due to her vision, denies getting lost driving. She denies missing medications. She has a chart for daily eye drop intake. She denies missing bill payments. She has not left the stove on. She denies misplacing things frequently. Family started noticing changes a couple of years ago where she would repeat the same stories. She was more forgetful, for instance asking if she drove or they drove to a baseball game they attended. Over the past year with the pandemic, she was not multitasking as much. No hygiene concerns, she is independent with dressing and bathing. There was also more anxiety and insomnia. The anxiety has improved. She has difficulty with sleep maintenance, she wakes up and thinks of so many things. She usually gets 8 hours of sleep and does not take naps. Mood is "pretty darn good." No personality changes, paranoia, or hallucinations. No family history of dementia. She has had several head injuries in her teens where she lost consciousness. She rarely drinks alcohol. She works on puzzles regularly and likes to draw and sketch.    She underwent Neuropsychological testing in October 2021, results  reviewed. There was note of prominent impairments across verbal memory, along with additional deficits in working memory and semantic fluency. She met criteria for Mild Neurocognitive Disorder, but likely towards the moderate to moderate-severe end of spectrum. Etiology was concerning for Alzheimer's disease, however pattern was not wholly consistent with AD (performed well across confrontation naming task and had retention of visual information).   MRI of the brain 04/22/2023 personally reviewed remarkable for generalized volume loss, and mild to moderate chronic small vessel ischemic changes similar to those of 2021, no apparent progression    Neuropsych evaluation August 2024. Briefly, results suggested severe impairment surrounding all aspects of learning and memory. Additional impairments were exhibited across processing speed, cognitive flexibility, and clock drawing. Further variability was exhibited across verbal fluency. Relative to her previous evaluation in October 2021, significant decline was exhibited across processing speed, cognitive flexibility, phonemic fluency, clock drawing, and aspects of visual memory. Severe verbal memory impairment was exhibited across both evaluations. Regarding etiology, concerns for underlying Alzheimer's disease were expressed at the time of her previous evaluation. This continues to represent my primary concern at the present time. Across memory testing, Angelica Serrano was fully amnestic (i.e., 0% retention) across all memory tasks and performed very poorly across yes/no recognition trials. This suggests rapid forgetting and a pronounced storage impairment, both of which represent the hallmark testing patterns of this illness. Weakness in semantic fluency and cognitive flexibility would follow typical disease progression. Objective evidence for gradual progressive decline surrounding both cognitive and functional abilities further aligns with a degenerative process such as  this illness. Intact confrontation naming remains encouraging. 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     PREVIOUS MEDICATIONS:   CURRENT MEDICATIONS:  Outpatient Encounter Medications as of 01/23/2024  Medication Sig   bimatoprost (LUMIGAN) 0.03 % ophthalmic solution Place 1 drop into the right eye at bedtime.   Brimonidine Tartrate-Timolol (COMBIGAN OP) Place 1 drop into the right eye 2 (two) times daily.   cholecalciferol (VITAMIN D3) 25 MCG (1000 UNIT) tablet Take 1,000 Units by mouth daily.   donepezil  (ARICEPT ) 10 MG tablet Take 10 mg by mouth at bedtime.   dorzolamide (TRUSOPT) 2 % ophthalmic solution Place 1 drop into the right eye 2 (two) times daily.   memantine  (NAMENDA ) 5 MG tablet Take 1  tablet (5 mg at night) for 2 weeks, then increase to 1 tablet (5 mg) twice a day   vitamin B-12 (CYANOCOBALAMIN ) 1000 MCG tablet Take 1,000 mcg by mouth daily.   [DISCONTINUED] alendronate (FOSAMAX) 70 MG tablet Take 70 mg by mouth once a week. Take with a full glass of water on an empty stomach. (Patient not taking: Reported on 04/22/2023)   [DISCONTINUED] atorvastatin (LIPITOR) 10 MG tablet Take 10 mg by mouth daily. (Patient not taking: Reported on 04/22/2023)   [DISCONTINUED] Omega-3 Fatty Acids (FISH OIL PO) Take 1 capsule by mouth 3 (three) times daily. (Patient not taking: Reported on 04/22/2023)   No facility-administered encounter medications on file as of 01/23/2024.        No data to display            04/23/2023    5:00 PM  Montreal Cognitive Assessment   Visuospatial/ Executive (0/5) 2  Naming (0/3) 3  Attention: Read list of digits (0/2) 2  Attention: Read list of letters (0/1) 1  Attention: Serial 7 subtraction starting at 100 (0/3) 0  Language: Repeat phrase (0/2) 1  Language : Fluency (0/1) 1  Abstraction (0/2) 0  Delayed Recall (0/5) 0  Orientation (0/6) 3  Total 13  Adjusted  Score (based on education) 13    Objective:     PHYSICAL EXAMINATION:    VITALS:   Vitals:   01/23/24 1602 01/23/24 1606  BP: (!) 141/68   Pulse: (!) 46 (!) 41  SpO2: 96%   Weight: 138 lb (62.6 kg)   Height: 5\' 3"  (1.6 m)     GEN:  The patient appears stated age and is in NAD. HEENT:  Normocephalic, atraumatic.   Neurological examination:  General: NAD, well-groomed, appears stated age. Orientation: The patient is alert. Oriented to person, place and not to date Cranial nerves: There is good facial symmetry.The speech is fluent and clear. No aphasia or dysarthria. Fund of knowledge is reduced. Recent and remote memory are impaired. Attention and concentration are reduced.  Able to name objects and repeat phrases.  Hearing is intact to conversational tone.  Sensation: Sensation is intact to light touch throughout Motor: Strength is at least antigravity x4. DTR's 2/4 in UE/LE     Movement examination: Tone: There is normal tone in the UE/LE Abnormal movements:  no tremor.  No myoclonus.  No asterixis.   Coordination:  There is no decremation with RAM's. Normal finger to nose  Gait and Station: The patient has no difficulty arising out of a deep-seated chair without the use of the hands. The patient's stride length is good.  Gait is cautious and narrow.    Thank you for allowing us  the opportunity to participate in the care of this nice patient. Please do not hesitate to contact us  for any questions or concerns.   Total time spent on today's visit was 25 minutes dedicated to this patient today, preparing to see patient, examining the patient, ordering tests and/or medications and counseling the patient, documenting clinical information in the EHR or other health record, independently interpreting results and communicating results to the patient/family, discussing treatment and goals, answering patient's questions and coordinating care.  Cc:  Bufford Carne, FNP  Tex Filbert 01/23/2024 4:48 PM

## 2024-01-23 NOTE — Patient Instructions (Signed)
 It was a pleasure to see you today at our office.   Recommendations:  Continue vitamin B12 1000 mcg daily.    Continue donepezil  10 mg daily. Side effects were discussed  Memantine  5mg  ,take one tab at night for 2 weeks then 1 tab twice a day    For psychiatric meds, mood meds: Please have your primary care physician manage these medications.  If you have any severe symptoms of a stroke, or other severe issues such as confusion,severe chills or fever, etc call 911 or go to the ER as you may need to be evaluated further  For guidance regarding [facility] and if placement were needed at the facility, contact Social Worker tel: [phone]  For assessment of decision of mental capacity and competency:  Call Dr. Laverne Potter, geriatric psychiatrist at [phone]  Counseling regarding caregiver distress, including caregiver depression, anxiety and issues regarding community resources, adult day care programs, adult living facilities, or memory care questions:  please contact your  Primary Doctor's Social Worker   Whom to call: Memory  decline, memory medications: Call our office [phone]    [email or web address]   RECOMMENDATIONS FOR ALL PATIENTS WITH MEMORY PROBLEMS: 1. Continue to exercise (Recommend 30 minutes of walking everyday, or 3 hours every week) 2. Increase social interactions - continue going to [place] and enjoy social gatherings with friends and family 3. Eat healthy, avoid fried foods and eat more fruits and vegetables 4. Maintain adequate blood pressure, blood sugar, and blood cholesterol level. Reducing the risk of stroke and cardiovascular disease also helps promoting better memory. 5. Avoid stressful situations. Live a simple life and avoid aggravations. Organize your time and prepare for the next day in anticipation. 6. Sleep well, avoid any interruptions of sleep and avoid any distractions in  the bedroom that may interfere with adequate sleep quality 7. Avoid sugar, avoid sweets as there is a strong link between excessive sugar intake, diabetes, and cognitive impairment We discussed the Mediterranean diet, which has been shown to help patients reduce the risk of progressive memory disorders and reduces cardiovascular risk. This includes eating fish, eat fruits and green leafy vegetables, nuts like almonds and hazelnuts, walnuts, and also use olive oil. Avoid fast foods and fried foods as much as possible. Avoid sweets and sugar as sugar use has been linked to worsening of memory function.  There is always a concern of gradual progression of memory problems. If this is the case, then we may need to adjust level of care according to patient needs. Support, both to the patient and caregiver, should then be put into place.         FALL PRECAUTIONS: Be cautious when walking. Scan the area for obstacles that may increase the risk of trips and falls. When getting up in the mornings, sit up at the edge of the bed for a few minutes before getting out of bed. Consider elevating the bed at the head end to avoid drop of blood pressure when getting up. Walk always in a well-lit room (use night lights in the walls). Avoid area rugs or power cords from appliances in the middle of the walkways. Use a walker or a cane if necessary and consider physical therapy for balance exercise. Get your eyesight checked regularly.  FINANCIAL OVERSIGHT: Supervision, especially oversight when making financial decisions or transactions is also recommended.  HOME SAFETY: Consider the safety of the kitchen when operating appliances like stoves, microwave oven, and blender. Consider having supervision and  share cooking responsibilities until no longer able to participate in those. Accidents with firearms and other hazards in the house should be identified and addressed as well.   ABILITY TO BE LEFT ALONE: If patient is  unable to contact 911 operator, consider using LifeLine, or when the need is there, arrange for someone to stay with patients. Smoking is a fire hazard, consider supervision or cessation. Risk of wandering should be assessed by caregiver and if detected at any point, supervision and safe proof recommendations should be instituted.  MEDICATION SUPERVISION: Inability to self-administer medication needs to be constantly addressed. Implement a mechanism to ensure safe administration of the medications.      Mediterranean Diet A Mediterranean diet refers to food and lifestyle choices that are based on the traditions of countries located on the [place]. This way of eating has been shown to help prevent certain conditions and improve outcomes for people who have chronic diseases, like kidney disease and heart disease. What are tips for following this plan? Lifestyle  Cook and eat meals together with your family, when possible. Drink enough fluid to keep your urine clear or pale yellow. Be physically active every day. This includes: Aerobic exercise like running or swimming. Leisure activities like gardening, walking, or housework. Get 7-8 hours of sleep each night. If recommended by your health care provider, drink red wine in moderation. This means 1 glass a day for nonpregnant women and 2 glasses a day for men. A glass of wine equals 5 oz (150 mL). Reading food labels  Check the serving size of packaged foods. For foods such as rice and pasta, the serving size refers to the amount of cooked product, not dry. Check the total fat in packaged foods. Avoid foods that have saturated fat or trans fats. Check the ingredients list for added sugars, such as corn syrup. Shopping  At the grocery store, buy most of your food from the areas near the walls of the store. This includes: Fresh fruits and vegetables (produce). Grains, beans, nuts, and seeds. Some of these may be available in unpackaged  forms or large amounts (in bulk). Fresh seafood. Poultry and eggs. Low-fat dairy products. Buy whole ingredients instead of prepackaged foods. Buy fresh fruits and vegetables in-season from local farmers markets. Buy frozen fruits and vegetables in resealable bags. If you do not have access to quality fresh seafood, buy precooked frozen shrimp or canned fish, such as tuna, salmon, or sardines. Buy small amounts of raw or cooked vegetables, salads, or olives from the deli or salad bar at your store. Stock your pantry so you always have certain foods on hand, such as olive oil, canned tuna, canned tomatoes, rice, pasta, and beans. Cooking  Cook foods with extra-virgin olive oil instead of using butter or other vegetable oils. Have meat as a side dish, and have vegetables or grains as your Sporer dish. This means having meat in small portions or adding small amounts of meat to foods like pasta or stew. Use beans or vegetables instead of meat in common dishes like chili or lasagna. Experiment with different cooking methods. Try roasting or broiling vegetables instead of steaming or sauteing them. Add frozen vegetables to soups, stews, pasta, or rice. Add nuts or seeds for added healthy fat at each meal. You can add these to yogurt, salads, or vegetable dishes. Marinate fish or vegetables using olive oil, lemon juice, garlic, and fresh herbs. Meal planning  Plan to eat 1 vegetarian meal one day each  week. Try to work up to 2 vegetarian meals, if possible. Eat seafood 2 or more times a week. Have healthy snacks readily available, such as: Vegetable sticks with hummus. Greek yogurt. Fruit and nut trail mix. Eat balanced meals throughout the week. This includes: Fruit: 2-3 servings a day Vegetables: 4-5 servings a day Low-fat dairy: 2 servings a day Fish, poultry, or lean meat: 1 serving a day Beans and legumes: 2 or more servings a week Nuts and seeds: 1-2 servings a day Whole grains: 6-8  servings a day Extra-virgin olive oil: 3-4 servings a day Limit red meat and sweets to only a few servings a month What are my food choices? Mediterranean diet Recommended Grains: Whole-grain pasta. Brown rice. Bulgar wheat. Polenta. Couscous. Whole-wheat bread. Dwyane Glad. Vegetables: Artichokes. Beets. Broccoli. Cabbage. Carrots. Eggplant. Green beans. Chard. Kale. Spinach. Onions. Leeks. Peas. Squash. Tomatoes. Peppers. Radishes. Fruits: Apples. Apricots. Avocado. Berries. Bananas. Cherries. Dates. Figs. Grapes. Lemons. Melon. Oranges. Peaches. Plums. Pomegranate. Meats and other protein foods: Beans. Almonds. Sunflower seeds. Pine nuts. Peanuts. Cod. Salmon. Scallops. Shrimp. Tuna. Tilapia. Clams. Oysters. Eggs. Dairy: Low-fat milk. Cheese. Greek yogurt. Beverages: Water. Red wine. Herbal tea. Fats and oils: Extra virgin olive oil. Avocado oil. Grape seed oil. Sweets and desserts: [place] yogurt with honey. Baked apples. Poached pears. Trail mix. Seasoning and other foods: Basil. Cilantro. Coriander. Cumin. Mint. Parsley. Sage. Rosemary. Tarragon. Garlic. Oregano. Thyme. Pepper. Balsalmic vinegar. Tahini. Hummus. Tomato sauce. Olives. Mushrooms. Limit these Grains: Prepackaged pasta or rice dishes. Prepackaged cereal with added sugar. Vegetables: Deep fried potatoes (french fries). Fruits: Fruit canned in syrup. Meats and other protein foods: Beef. Pork. Lamb. Poultry with skin. Hot dogs. Helene Loader. Dairy: Ice cream. Sour cream. Whole milk. Beverages: Juice. Sugar-sweetened soft drinks. Beer. Liquor and spirits. Fats and oils: Butter. Canola oil. Vegetable oil. Beef fat (tallow). Lard. Sweets and desserts: Cookies. Cakes. Pies. Candy. Seasoning and other foods: Mayonnaise. Premade sauces and marinades. The items listed may not be a complete list. Talk with your dietitian about what dietary choices are right for you. Summary The Mediterranean diet includes both food and lifestyle  choices. Eat a variety of fresh fruits and vegetables, beans, nuts, seeds, and whole grains. Limit the amount of red meat and sweets that you eat. Talk with your health care provider about whether it is safe for you to drink red wine in moderation. This means 1 glass a day for nonpregnant women and 2 glasses a day for men. A glass of wine equals 5 oz (150 mL). This information is not intended to replace advice given to you by your health care provider. Make sure you discuss any questions you have with your health care provider. Document Released: 05/03/2016 Document Revised: 06/05/2016 Document Reviewed: 05/03/2016 Elsevier Interactive Patient Education  2017 [employer].     [place] [facility] [phone]

## 2024-02-04 DIAGNOSIS — I440 Atrioventricular block, first degree: Secondary | ICD-10-CM | POA: Diagnosis not present

## 2024-02-04 DIAGNOSIS — H9190 Unspecified hearing loss, unspecified ear: Secondary | ICD-10-CM | POA: Diagnosis not present

## 2024-02-04 DIAGNOSIS — E538 Deficiency of other specified B group vitamins: Secondary | ICD-10-CM | POA: Diagnosis not present

## 2024-02-04 DIAGNOSIS — R001 Bradycardia, unspecified: Secondary | ICD-10-CM | POA: Diagnosis not present

## 2024-02-04 DIAGNOSIS — H9313 Tinnitus, bilateral: Secondary | ICD-10-CM | POA: Diagnosis not present

## 2024-02-04 DIAGNOSIS — G300 Alzheimer's disease with early onset: Secondary | ICD-10-CM | POA: Diagnosis not present

## 2024-02-05 DIAGNOSIS — H5213 Myopia, bilateral: Secondary | ICD-10-CM | POA: Diagnosis not present

## 2024-02-05 DIAGNOSIS — H401133 Primary open-angle glaucoma, bilateral, severe stage: Secondary | ICD-10-CM | POA: Diagnosis not present

## 2024-02-05 DIAGNOSIS — H59032 Cystoid macular edema following cataract surgery, left eye: Secondary | ICD-10-CM | POA: Diagnosis not present

## 2024-02-05 DIAGNOSIS — H4032X3 Glaucoma secondary to eye trauma, left eye, severe stage: Secondary | ICD-10-CM | POA: Diagnosis not present

## 2024-02-11 DIAGNOSIS — K116 Mucocele of salivary gland: Secondary | ICD-10-CM | POA: Diagnosis not present

## 2024-02-11 DIAGNOSIS — L821 Other seborrheic keratosis: Secondary | ICD-10-CM | POA: Diagnosis not present

## 2024-02-11 DIAGNOSIS — D225 Melanocytic nevi of trunk: Secondary | ICD-10-CM | POA: Diagnosis not present

## 2024-02-11 DIAGNOSIS — L814 Other melanin hyperpigmentation: Secondary | ICD-10-CM | POA: Diagnosis not present

## 2024-02-11 DIAGNOSIS — D0001 Carcinoma in situ of labial mucosa and vermilion border: Secondary | ICD-10-CM | POA: Diagnosis not present

## 2024-02-18 DIAGNOSIS — G300 Alzheimer's disease with early onset: Secondary | ICD-10-CM | POA: Diagnosis not present

## 2024-02-18 DIAGNOSIS — R001 Bradycardia, unspecified: Secondary | ICD-10-CM | POA: Diagnosis not present

## 2024-02-18 DIAGNOSIS — I440 Atrioventricular block, first degree: Secondary | ICD-10-CM | POA: Diagnosis not present

## 2024-02-22 DIAGNOSIS — H409 Unspecified glaucoma: Secondary | ICD-10-CM | POA: Diagnosis not present

## 2024-02-22 DIAGNOSIS — F418 Other specified anxiety disorders: Secondary | ICD-10-CM | POA: Diagnosis not present

## 2024-02-22 DIAGNOSIS — I10 Essential (primary) hypertension: Secondary | ICD-10-CM | POA: Diagnosis not present

## 2024-02-22 DIAGNOSIS — J4530 Mild persistent asthma, uncomplicated: Secondary | ICD-10-CM | POA: Diagnosis not present

## 2024-02-24 ENCOUNTER — Telehealth: Payer: Self-pay | Admitting: Physician Assistant

## 2024-02-24 NOTE — Telephone Encounter (Signed)
 Pt's daughter in law called in wanting to get permission to start the pt back on the Aricept .

## 2024-02-25 NOTE — Telephone Encounter (Signed)
 I advised to call back with an update in a few weeks, She thanked me for calling.

## 2024-03-03 DIAGNOSIS — H4032X3 Glaucoma secondary to eye trauma, left eye, severe stage: Secondary | ICD-10-CM | POA: Diagnosis not present

## 2024-03-03 DIAGNOSIS — H401133 Primary open-angle glaucoma, bilateral, severe stage: Secondary | ICD-10-CM | POA: Diagnosis not present

## 2024-03-12 DIAGNOSIS — H903 Sensorineural hearing loss, bilateral: Secondary | ICD-10-CM | POA: Diagnosis not present

## 2024-04-07 DIAGNOSIS — H401133 Primary open-angle glaucoma, bilateral, severe stage: Secondary | ICD-10-CM | POA: Diagnosis not present

## 2024-04-07 DIAGNOSIS — H59032 Cystoid macular edema following cataract surgery, left eye: Secondary | ICD-10-CM | POA: Diagnosis not present

## 2024-04-07 DIAGNOSIS — H5213 Myopia, bilateral: Secondary | ICD-10-CM | POA: Diagnosis not present

## 2024-04-07 DIAGNOSIS — H4032X3 Glaucoma secondary to eye trauma, left eye, severe stage: Secondary | ICD-10-CM | POA: Diagnosis not present

## 2024-04-08 DIAGNOSIS — Z08 Encounter for follow-up examination after completed treatment for malignant neoplasm: Secondary | ICD-10-CM | POA: Diagnosis not present

## 2024-04-08 DIAGNOSIS — Z09 Encounter for follow-up examination after completed treatment for conditions other than malignant neoplasm: Secondary | ICD-10-CM | POA: Diagnosis not present

## 2024-04-08 DIAGNOSIS — Z86007 Personal history of in-situ neoplasm of skin: Secondary | ICD-10-CM | POA: Diagnosis not present

## 2024-04-23 DIAGNOSIS — F418 Other specified anxiety disorders: Secondary | ICD-10-CM | POA: Diagnosis not present

## 2024-04-23 DIAGNOSIS — I10 Essential (primary) hypertension: Secondary | ICD-10-CM | POA: Diagnosis not present

## 2024-04-23 DIAGNOSIS — J4530 Mild persistent asthma, uncomplicated: Secondary | ICD-10-CM | POA: Diagnosis not present

## 2024-04-23 DIAGNOSIS — H409 Unspecified glaucoma: Secondary | ICD-10-CM | POA: Diagnosis not present

## 2024-04-24 DIAGNOSIS — H903 Sensorineural hearing loss, bilateral: Secondary | ICD-10-CM | POA: Diagnosis not present

## 2024-05-05 ENCOUNTER — Telehealth: Payer: Self-pay | Admitting: Physician Assistant

## 2024-05-05 NOTE — Telephone Encounter (Signed)
 I left message to call PCP Camie is out of the office, to call back if she has any further questions.

## 2024-05-05 NOTE — Telephone Encounter (Signed)
 Left a message with the after hour service  on 05-05-24  Callers states that she is calling because the patient has a new level of confusion. She has appt in Nov with Camie and does not want to wait until then. Caller states that pt is having increased confusion  noticed about  a month ago and declined through last few weeks. most of the time think she knows  who she is and family  does not know where she is or the time

## 2024-05-11 DIAGNOSIS — F22 Delusional disorders: Secondary | ICD-10-CM | POA: Diagnosis not present

## 2024-05-11 DIAGNOSIS — F05 Delirium due to known physiological condition: Secondary | ICD-10-CM | POA: Diagnosis not present

## 2024-05-11 DIAGNOSIS — R001 Bradycardia, unspecified: Secondary | ICD-10-CM | POA: Diagnosis not present

## 2024-05-11 DIAGNOSIS — R410 Disorientation, unspecified: Secondary | ICD-10-CM | POA: Diagnosis not present

## 2024-05-12 DIAGNOSIS — F22 Delusional disorders: Secondary | ICD-10-CM | POA: Diagnosis not present

## 2024-05-12 DIAGNOSIS — R410 Disorientation, unspecified: Secondary | ICD-10-CM | POA: Diagnosis not present

## 2024-05-12 DIAGNOSIS — F05 Delirium due to known physiological condition: Secondary | ICD-10-CM | POA: Diagnosis not present

## 2024-05-22 ENCOUNTER — Telehealth: Payer: Self-pay | Admitting: Physician Assistant

## 2024-05-22 NOTE — Telephone Encounter (Signed)
 Angelica Serrano the daughter in law called and stated that her mother ( pt) is having real bad hallucination and she would like to speak to a nurse . Angelica Serrano stated they went to her PCP and the PCP recommended a new prescription called: Citaloprim . Angelica Serrano is concern about the prescription .  Please  call.  Thanks

## 2024-05-22 NOTE — Telephone Encounter (Signed)
 Had to speak to Angelica Serrano, she is not on DPR.

## 2024-05-22 NOTE — Telephone Encounter (Signed)
 I advised per Dr.Aquino to go okay and give cymbalta and resume Aricept . They thanked me for calling.

## 2024-05-22 NOTE — Telephone Encounter (Signed)
 Ok to stay on recently started medication, Aricept , and also should be okay to start Citalopram.  Monitor heart rate with PCP, thanks

## 2024-05-22 NOTE — Telephone Encounter (Signed)
 Patient has never been On memantine  only aricept  10 and Dc'ed due to bradycardia for months , PCP restarted this back am at Aricept  5mg  this am. Please readvise.

## 2024-05-24 DIAGNOSIS — F418 Other specified anxiety disorders: Secondary | ICD-10-CM | POA: Diagnosis not present

## 2024-05-24 DIAGNOSIS — H409 Unspecified glaucoma: Secondary | ICD-10-CM | POA: Diagnosis not present

## 2024-05-24 DIAGNOSIS — I10 Essential (primary) hypertension: Secondary | ICD-10-CM | POA: Diagnosis not present

## 2024-05-24 DIAGNOSIS — J4530 Mild persistent asthma, uncomplicated: Secondary | ICD-10-CM | POA: Diagnosis not present

## 2024-06-24 DIAGNOSIS — R7303 Prediabetes: Secondary | ICD-10-CM | POA: Diagnosis not present

## 2024-06-24 DIAGNOSIS — E785 Hyperlipidemia, unspecified: Secondary | ICD-10-CM | POA: Diagnosis not present

## 2024-06-24 DIAGNOSIS — I10 Essential (primary) hypertension: Secondary | ICD-10-CM | POA: Diagnosis not present

## 2024-06-24 DIAGNOSIS — F418 Other specified anxiety disorders: Secondary | ICD-10-CM | POA: Diagnosis not present

## 2024-06-24 DIAGNOSIS — R001 Bradycardia, unspecified: Secondary | ICD-10-CM | POA: Diagnosis not present

## 2024-06-24 DIAGNOSIS — H409 Unspecified glaucoma: Secondary | ICD-10-CM | POA: Diagnosis not present

## 2024-06-24 DIAGNOSIS — Z23 Encounter for immunization: Secondary | ICD-10-CM | POA: Diagnosis not present

## 2024-06-24 DIAGNOSIS — Z0000 Encounter for general adult medical examination without abnormal findings: Secondary | ICD-10-CM | POA: Diagnosis not present

## 2024-06-24 DIAGNOSIS — Z1331 Encounter for screening for depression: Secondary | ICD-10-CM | POA: Diagnosis not present

## 2024-06-24 DIAGNOSIS — G300 Alzheimer's disease with early onset: Secondary | ICD-10-CM | POA: Diagnosis not present

## 2024-06-24 DIAGNOSIS — J4530 Mild persistent asthma, uncomplicated: Secondary | ICD-10-CM | POA: Diagnosis not present

## 2024-06-24 DIAGNOSIS — F05 Delirium due to known physiological condition: Secondary | ICD-10-CM | POA: Diagnosis not present

## 2024-07-26 NOTE — Progress Notes (Signed)
 Assessment/Plan:   Dementia likely due to Alzheimer's disease  Angelica Serrano is a very pleasant 83 y.o. RH female with a history of hypertension, hyperlipidemia, glaucoma, GAD, chronic fatigue, prediabetes and a diagnosis of dementia likely due to Alzheimer's disease per neuropsych evaluation August 2024 seen today in follow up for memory loss. Patient is currently on donepezil  5  mg daily ( due to bradycardia with higher dose). Memory is stable with MMSE today at 20/30.Patient is able to participate on ADLs. Mood is better controlled with Cymbalta    Follow up in  6 months. Recommend good control of her cardiovascular risk factors, follow bradycardia with cardiology Continue donepezil  5 mg daily. Side effects were discussed   Continue to control mood as per PCP Recommend hearing evaluation to improve comprehension      Subjective:    This patient is accompanied in the office by her son who supplements the history.  Previous records as well as any outside records available were reviewed prior to todays visit. Patient was last seen on 01/23/24     Any changes in memory since last visit? Memory is about the same.  Patient has some difficulty remembering recent conversations and names of people. She confabulates. She does not like using her hearing aids which affects comprehension repeats oneself?  Endorsed Disoriented when walking into a room? Denies    Leaving objects?  May misplace things but not in unusual places   Wandering behavior?  denies   Any personality changes since last visit?  I feel better, calmer  Any worsening depression?:  Denies.   Hallucinations or paranoia?  The shadow I used to see is dimmer, it's not affecting me anymore  Seizures? denies    Any sleep changes?  Denies vivid dreams, REM behavior or sleepwalking   Sleep apnea?   Denies.   Any hygiene concerns? Denies.  Independent of bathing and dressing?  Endorsed  Does the patient needs help with  medications? Son  is in charge   Who is in charge of the finances?   Daughter in law is in charge     Any changes in appetite?Needs to be reminded to eat or drink.    Patient have trouble swallowing? Denies.   Does the patient cook? No Any headaches?   denies   Any vision changes? Denies. Sees ophthalmology on a regular basis  Chronic back pain  denies   Ambulates with difficulty? Denies. She walks daily    Recent falls or head injuries? Denies.     Unilateral weakness, numbness or tingling? denies   Any tremors?  Denies    Any anosmia?  Denies   Any incontinence of urine?  Endorsed   Any bowel dysfunction?   Denies      Patient lives with her son     Does the patient drive? No longer drives     Initial visit 04/22/2023  How long did patient have memory difficulties?  She continues to have difficulty with conversations, names of people.  Daughter reports that around 2022 until 2023 she had reached a plateau regarding her memory, but over the last 6 months, the daughter reports some decline, which may be coincidental with her moving to somebody's house.  Daughter is unsure if that affected her cognitive status or not.  Although she likes to be with her family, there is no other social in the interaction.  She likes to do puzzles, draw.   Repeats oneself?  Endorsed Disoriented when walking  into a room?  As mentioned before, the patient has moved to somebody else's house, and has been normal disorientation of what things may be . Leaving objects in unusual places?  denies   Wandering behavior? denies   Any personality changes ?  She may have some moments of irritability.  She states sometimes when they cannot keep up with me I might be more irritable -she jokes (daughter reports that the patient is very sarcastic in general) Any history of depression?:  patient has a history of anxiety, and possibly situational depression.  Daughter reports that this may commensurate with her memory  loss. Hallucinations or paranoia? denies   Seizures? denies    Any sleep changes?  Sleeps well, about 5 to 6 hours.  Most of the time she feels rested.  Denies frequent vivid dreams, REM behavior or sleepwalking   Sleep apnea? denies   Any hygiene concerns?  denies   Independent of bathing and dressing? Endorsed  Does the patient need help with medications?  Patient is in charge, daughter supervises, as over the last 3 or 4 weeks, she may forget some doses. Who is in charge of the finances? Bills are on autopay     Any changes in appetite?  Her appetite may be decreased, although daughter is suspicious that she may be forgetting to eat.  She admits to not drinking enough water.    Patient have trouble swallowing?  denies   Does the patient cook? No  Any headaches?  denies   Chronic pain? denies   Ambulates with difficulty? Denies      Recent falls or head injuries? denies     Vision changes?  She uses eyedrops but no worsening vision. Unilateral weakness, numbness or tingling? denies   Any tremors? denies Any anosmia? denies   Any incontinence of urine? denies   Any bowel dysfunction? denies      History of heavy alcohol intake? denies   History of heavy tobacco use? denies   Family history of dementia?  Denies  Does patient drive? 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 Her daughter-in-law [name] is present to provide additional information. She feels her memory is a little bad but not where it bothers me. She cannot answer a question quickly when put on the spot, but eventually can answer it. She lives alone. She has reduced driving due to her vision, denies getting lost driving. She denies missing medications. She has a chart for daily eye drop intake. She denies missing bill payments. She has not left the stove on. She  denies misplacing things frequently. Family started noticing changes a couple of years ago where she would repeat the same stories. She was more forgetful, for instance asking if she drove or they drove to a baseball game they attended. Over the past year with the pandemic, she was not multitasking as much. No hygiene concerns, she is independent with dressing and bathing. There was also more anxiety and insomnia. The anxiety has improved. She has difficulty with sleep maintenance, she wakes up and thinks of so many things. She usually gets 8 hours of sleep and does not take naps. Mood is pretty darn good. No personality changes, paranoia, or hallucinations. No family history of dementia. She has had several head injuries in her teens where she lost consciousness. She rarely drinks  alcohol. She works on puzzles regularly and likes to draw and sketch.    She underwent Neuropsychological testing in October 2021, results reviewed. There was note of prominent impairments across verbal memory, along with additional deficits in working memory and semantic fluency. She met criteria for Mild Neurocognitive Disorder, but likely towards the moderate to moderate-severe end of spectrum. Etiology was concerning for Alzheimer's disease, however pattern was not wholly consistent with AD (performed well across confrontation naming task and had retention of visual information).    MRI of the brain 04/22/2023 personally reviewed remarkable for generalized volume loss, and mild to moderate chronic small vessel ischemic changes similar to those of 2021, no apparent progression     Neuropsych evaluation August 2024. Briefly, results suggested severe impairment surrounding all aspects of learning and memory. Additional impairments were exhibited across processing speed, cognitive flexibility, and clock drawing. Further variability was exhibited across verbal fluency. Relative to her previous evaluation in October 2021,  significant decline was exhibited across processing speed, cognitive flexibility, phonemic fluency, clock drawing, and aspects of visual memory. Severe verbal memory impairment was exhibited across both evaluations. Regarding etiology, concerns for underlying Alzheimer's disease were expressed at the time of her previous evaluation. This continues to represent my primary concern at the present time. Across memory testing, Ms. Flicker was fully amnestic (i.e., 0% retention) across all memory tasks and performed very poorly across yes/no recognition trials. This suggests rapid forgetting and a pronounced storage impairment, both of which represent the hallmark testing patterns of this illness. Weakness in semantic fluency and cognitive flexibility would follow typical disease progression. Objective evidence for gradual progressive decline surrounding both cognitive and functional abilities further aligns with a degenerative process such as this illness. Intact confrontation naming remains encouraging. 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 PREVIOUS MEDICATIONS:   CURRENT MEDICATIONS:  Outpatient Encounter Medications as of 07/27/2024  Medication Sig   bimatoprost (LUMIGAN) 0.03 % ophthalmic solution Place 1 drop into the right eye at bedtime.   Brimonidine Tartrate-Timolol (COMBIGAN OP) Place 1 drop into the right eye 2 (two) times daily.   cholecalciferol (VITAMIN D3) 25 MCG (1000 UNIT) tablet Take 1,000 Units by mouth daily.   citalopram (CELEXA) 10 MG tablet Take 10 mg by mouth daily.   donepezil  (ARICEPT ) 10 MG tablet Take 10 mg by mouth at bedtime. (Patient taking differently: Take 5 mg by mouth at bedtime.)   dorzolamide (TRUSOPT) 2 % ophthalmic solution Place 1 drop into the right eye 2 (two) times daily.   Omega-3 Fatty Acids (FISH OIL) 1000 MG CAPS 1 capsule.   vitamin B-12 (CYANOCOBALAMIN ) 1000 MCG tablet  Take 1,000 mcg by mouth daily. (Patient taking differently: Take 1,000 mcg by mouth every other day.)   [DISCONTINUED] memantine  (NAMENDA ) 5 MG tablet Take 1 tablet (5 mg at night) for 2 weeks, then increase to 1 tablet (5 mg) twice a day   No facility-administered encounter medications on file as of 07/27/2024.       07/27/2024    6:00 PM  MMSE - Mini Mental State Exam  Orientation to time 1  Orientation to Place 4  Registration 3  Attention/ Calculation 4  Recall 0  Language- name 2 objects 2  Language- repeat 1  Language- follow 3 step command 3  Language- read & follow direction 1  Write a sentence 1  Copy design 0  Total score 20  04/23/2023    5:00 PM  Montreal Cognitive Assessment   Visuospatial/ Executive (0/5) 2  Naming (0/3) 3  Attention: Read list of digits (0/2) 2  Attention: Read list of letters (0/1) 1  Attention: Serial 7 subtraction starting at 100 (0/3) 0  Language: Repeat phrase (0/2) 1  Language : Fluency (0/1) 1  Abstraction (0/2) 0  Delayed Recall (0/5) 0  Orientation (0/6) 3  Total 13  Adjusted Score (based on education) 13    Objective:     PHYSICAL EXAMINATION:    VITALS:   Vitals:   07/27/24 1610 07/27/24 1618  BP: (!) 175/80 (!) 145/65  Pulse: (!) 58   SpO2: 95%   Weight: 133 lb (60.3 kg)   Height: 5' (1.524 m)     GEN:  The patient appears stated age and is in NAD. HEENT:  Normocephalic, atraumatic.   Neurological examination:  General: NAD, well-groomed, appears stated age. Orientation: The patient is alert. Oriented to person, place and not to date Cranial nerves: There is good facial symmetry.The speech is fluent and clear. No aphasia or dysarthria. Fund of knowledge is reduced. Recent and remote memory are impaired. Attention and concentration are reduced. Able to name objects and repeat phrases.  Hearing is decreased to conversational tone.   Sensation: Sensation is intact to light touch throughout Motor: Strength is at  least antigravity x4. DTR's 2/4 in UE/LE     Movement examination: Tone: There is normal tone in the UE/LE Abnormal movements:  no tremor.  No myoclonus.  No asterixis.   Coordination:  There is no decremation with RAM's. Normal finger to nose  Gait and Station: The patient has no  difficulty arising out of a deep-seated chair without the use of the hands. The patient's stride length is good.  Gait is cautious and narrow.    Thank you for allowing us  the opportunity to participate in the care of this nice patient. Please do not hesitate to contact us  for any questions or concerns.   Total time spent on today's visit was 31 minutes dedicated to this patient today, preparing to see patient, examining the patient, ordering tests and/or medications and counseling the patient, documenting clinical information in the EHR or other health record, independently interpreting results and communicating results to the patient/family, discussing treatment and goals, answering patient's questions and coordinating care.  Cc:  Dyane Anthony RAMAN, FNP  Camie Sevin 07/27/2024 6:13 PM

## 2024-07-27 ENCOUNTER — Ambulatory Visit: Admitting: Physician Assistant

## 2024-07-27 VITALS — BP 145/65 | HR 58 | Ht 60.0 in | Wt 133.0 lb

## 2024-07-27 DIAGNOSIS — G309 Alzheimer's disease, unspecified: Secondary | ICD-10-CM

## 2024-07-27 DIAGNOSIS — F0280 Dementia in other diseases classified elsewhere without behavioral disturbance: Secondary | ICD-10-CM

## 2024-07-27 NOTE — Patient Instructions (Addendum)
 It was a pleasure to see you today at our office.   Recommendations:    Continue donepezil  5 mg daily. Side effects were discussed       [email or web address]/neuro-[email or web address]-apps-[email or web address]-[email or web address]   RECOMMENDATIONS FOR ALL PATIENTS WITH MEMORY PROBLEMS: 1. Continue to exercise (Recommend 30 minutes of walking everyday, or 3 hours every week) 2. Increase social interactions - continue going to [place] and enjoy social gatherings with friends and family 3. Eat healthy, avoid fried foods and eat more fruits and vegetables 4. Maintain adequate blood pressure, blood sugar, and blood cholesterol level. Reducing the risk of stroke and cardiovascular disease also helps promoting better memory. 5. Avoid stressful situations. Live a simple life and avoid aggravations. Organize your time and prepare for the next day in anticipation. 6. Sleep well, avoid any interruptions of sleep and avoid any distractions in the bedroom that may interfere with adequate sleep quality 7. Avoid sugar, avoid sweets as there is a strong link between excessive sugar intake, diabetes, and cognitive impairment We discussed the Mediterranean diet, which has been shown to help patients reduce the risk of progressive memory disorders and reduces cardiovascular risk. This includes eating fish, eat fruits and green leafy vegetables, nuts like almonds and hazelnuts, walnuts, and also use olive oil. Avoid fast foods and fried foods as much as possible. Avoid sweets and sugar as sugar use has been linked to worsening of memory function.  There is always a concern of gradual progression of memory problems. If this is the case, then we may need to adjust level of care according to patient needs. Support, both to the patient and caregiver, should then be put into place.         FALL PRECAUTIONS: Be cautious when walking. Scan the area for obstacles that may increase the risk of trips and falls. When getting up in the  mornings, sit up at the edge of the bed for a few minutes before getting out of bed. Consider elevating the bed at the head end to avoid drop of blood pressure when getting up. Walk always in a well-lit room (use night lights in the walls). Avoid area rugs or power cords from appliances in the middle of the walkways. Use a walker or a cane if necessary and consider physical therapy for balance exercise. Get your eyesight checked regularly.  FINANCIAL OVERSIGHT: Supervision, especially oversight when making financial decisions or transactions is also recommended.  HOME SAFETY: Consider the safety of the kitchen when operating appliances like stoves, microwave oven, and blender. Consider having supervision and share cooking responsibilities until no longer able to participate in those. Accidents with firearms and other hazards in the house should be identified and addressed as well.   ABILITY TO BE LEFT ALONE: If patient is unable to contact 911 operator, consider using LifeLine, or when the need is there, arrange for someone to stay with patients. Smoking is a fire hazard, consider supervision or cessation. Risk of wandering should be assessed by caregiver and if detected at any point, supervision and safe proof recommendations should be instituted.  MEDICATION SUPERVISION: Inability to self-administer medication needs to be constantly addressed. Implement a mechanism to ensure safe administration of the medications.      Mediterranean Diet A Mediterranean diet refers to food and lifestyle choices that are based on the traditions of countries located on the [employer] [place]. This way of eating has been shown to help prevent certain conditions and improve outcomes for people who have chronic diseases, like kidney  disease and heart disease. What are tips for following this plan? Lifestyle  Cook and eat meals together with your family, when possible. Drink enough fluid to keep your urine clear or  pale yellow. Be physically active every day. This includes: Aerobic exercise like running or swimming. Leisure activities like gardening, walking, or housework. Get 7-8 hours of sleep each night. If recommended by your health care provider, drink red wine in moderation. This means 1 glass a day for nonpregnant women and 2 glasses a day for men. A glass of wine equals 5 oz (150 mL). Reading food labels  Check the serving size of packaged foods. For foods such as rice and pasta, the serving size refers to the amount of cooked product, not dry. Check the total fat in packaged foods. Avoid foods that have saturated fat or trans fats. Check the ingredients list for added sugars, such as corn syrup. Shopping  At the grocery store, buy most of your food from the areas near the walls of the store. This includes: Fresh fruits and vegetables (produce). Grains, beans, nuts, and seeds. Some of these may be available in unpackaged forms or large amounts (in bulk). Fresh seafood. Poultry and eggs. Low-fat dairy products. Buy whole ingredients instead of prepackaged foods. Buy fresh fruits and vegetables in-season from local farmers markets. Buy frozen fruits and vegetables in resealable bags. If you do not have access to quality fresh seafood, buy precooked frozen shrimp or canned fish, such as tuna, salmon, or sardines. Buy small amounts of raw or cooked vegetables, salads, or olives from the deli or salad bar at your store. Stock your pantry so you always have certain foods on hand, such as olive oil, canned tuna, canned tomatoes, rice, pasta, and beans. Cooking  Cook foods with extra-virgin olive oil instead of using butter or other vegetable oils. Have meat as a side dish, and have vegetables or grains as your [place] dish. This means having meat in small portions or adding small amounts of meat to foods like pasta or stew. Use beans or vegetables instead of meat in common dishes like chili or  lasagna. Experiment with different cooking methods. Try roasting or broiling vegetables instead of steaming or sauteing them. Add frozen vegetables to soups, stews, pasta, or rice. Add nuts or seeds for added healthy fat at each meal. You can add these to yogurt, salads, or vegetable dishes. Marinate fish or vegetables using olive oil, lemon juice, garlic, and fresh herbs. Meal planning  Plan to eat 1 vegetarian meal one day each week. Try to work up to 2 vegetarian meals, if possible. Eat seafood 2 or more times a week. Have healthy snacks readily available, such as: Vegetable sticks with hummus. Greek yogurt. Fruit and nut trail mix. Eat balanced meals throughout the week. This includes: Fruit: 2-3 servings a day Vegetables: 4-5 servings a day Low-fat dairy: 2 servings a day Fish, poultry, or lean meat: 1 serving a day Beans and legumes: 2 or more servings a week Nuts and seeds: 1-2 servings a day Whole grains: 6-8 servings a day Extra-virgin olive oil: 3-4 servings a day Limit red meat and sweets to only a few servings a month What are my food choices? Mediterranean diet Recommended Grains: Whole-grain pasta. Brown rice. Bulgar wheat. Polenta. Couscous. Whole-wheat bread. [place] Made[place]. Vegetables: Artichokes. Beets. Broccoli. Cabbage. Carrots. Eggplant. Green beans. Chard. Kale. Spinach. Onions. Leeks. Peas. Squash. Tomatoes. Peppers. Radishes. Fruits: Apples. Apricots. Avocado. Berries. Bananas. Cherries. Dates. Figs. Grapes. Lemons. Melon.  Oranges. Peaches. Plums. Pomegranate. Meats and other protein foods: Beans. Almonds. Sunflower seeds. Pine nuts. Peanuts. Cod. Salmon. Scallops. Shrimp. Tuna. Tilapia. Clams. Oysters. Eggs. Dairy: Low-fat milk. Cheese. Greek yogurt. Beverages: Water. Red wine. Herbal tea. Fats and oils: Extra virgin olive oil. Avocado oil. Grape seed oil. Sweets and desserts: Greek yogurt with honey. Baked apples. Poached pears. Trail mix. Seasoning and  other foods: Basil. Cilantro. Coriander. Cumin. Mint. Parsley. Sage. Rosemary. Tarragon. Garlic. Oregano. Thyme. Pepper. Balsalmic vinegar. Tahini. Hummus. Tomato sauce. Olives. Mushrooms. Limit these Grains: Prepackaged pasta or rice dishes. Prepackaged cereal with added sugar. Vegetables: Deep fried potatoes (french fries). Fruits: Fruit canned in syrup. Meats and other protein foods: Beef. Pork. Lamb. Poultry with skin. Hot dogs. [place]. Dairy: Ice cream. Sour cream. Whole milk. Beverages: Juice. Sugar-sweetened soft drinks. Beer. Liquor and spirits. Fats and oils: Butter. Canola oil. Vegetable oil. Beef fat (tallow). Lard. Sweets and desserts: Cookies. Cakes. Pies. Candy. Seasoning and other foods: Mayonnaise. Premade sauces and marinades. The items listed may not be a complete list. Talk with your dietitian about what dietary choices are right for you. Summary The Mediterranean diet includes both food and lifestyle choices. Eat a variety of fresh fruits and vegetables, beans, nuts, seeds, and whole grains. Limit the amount of red meat and sweets that you eat. Talk with your health care provider about whether it is safe for you to drink red wine in moderation. This means 1 glass a day for nonpregnant women and 2 glasses a day for men. A glass of wine equals 5 oz (150 mL). This information is not intended to replace advice given to you by your health care provider. Make sure you discuss any questions you have with your health care provider. Document Released: 05/03/2016 Document Revised: 06/05/2016 Document Reviewed: 05/03/2016 Elsevier Interactive Patient Education  2017 [employer].     [place] [facility] [phone]

## 2024-08-12 DIAGNOSIS — Z85828 Personal history of other malignant neoplasm of skin: Secondary | ICD-10-CM | POA: Diagnosis not present

## 2024-08-12 DIAGNOSIS — D1801 Hemangioma of skin and subcutaneous tissue: Secondary | ICD-10-CM | POA: Diagnosis not present

## 2024-08-12 DIAGNOSIS — Z08 Encounter for follow-up examination after completed treatment for malignant neoplasm: Secondary | ICD-10-CM | POA: Diagnosis not present

## 2024-08-12 DIAGNOSIS — L814 Other melanin hyperpigmentation: Secondary | ICD-10-CM | POA: Diagnosis not present

## 2024-08-12 DIAGNOSIS — L821 Other seborrheic keratosis: Secondary | ICD-10-CM | POA: Diagnosis not present

## 2024-08-12 DIAGNOSIS — Z86007 Personal history of in-situ neoplasm of skin: Secondary | ICD-10-CM | POA: Diagnosis not present

## 2024-08-12 DIAGNOSIS — Z09 Encounter for follow-up examination after completed treatment for conditions other than malignant neoplasm: Secondary | ICD-10-CM | POA: Diagnosis not present

## 2025-01-25 ENCOUNTER — Ambulatory Visit: Admitting: Physician Assistant
